# Patient Record
Sex: Female | Born: 1950 | Race: White | Hispanic: No | Marital: Single
Health system: Southern US, Community
[De-identification: ages and names within clinical notes are randomized; demographics above are authoritative.]

## PROBLEM LIST (undated history)

## (undated) DIAGNOSIS — I480 Paroxysmal atrial fibrillation: Secondary | ICD-10-CM

## (undated) DIAGNOSIS — A419 Sepsis, unspecified organism: Secondary | ICD-10-CM

## (undated) DIAGNOSIS — J15211 Pneumonia due to Methicillin susceptible Staphylococcus aureus: Secondary | ICD-10-CM

## (undated) DIAGNOSIS — U071 COVID-19: Secondary | ICD-10-CM

## (undated) DIAGNOSIS — J9621 Acute and chronic respiratory failure with hypoxia: Secondary | ICD-10-CM

## (undated) DIAGNOSIS — I5022 Chronic systolic (congestive) heart failure: Secondary | ICD-10-CM

## (undated) DIAGNOSIS — R6520 Severe sepsis without septic shock: Secondary | ICD-10-CM

---

## 2019-06-19 ENCOUNTER — Inpatient Hospital Stay
Admission: RE | Admit: 2019-06-19 | Discharge: 2019-08-05 | Disposition: A | Discharge status: Skilled Nursing Facility | Payer: Medicare Other | Attending: Internal Medicine | Admitting: Internal Medicine

## 2019-06-19 DIAGNOSIS — J9690 Respiratory failure, unspecified, unspecified whether with hypoxia or hypercapnia: Secondary | ICD-10-CM

## 2019-06-19 DIAGNOSIS — I480 Paroxysmal atrial fibrillation: Secondary | ICD-10-CM | POA: Diagnosis present

## 2019-06-19 DIAGNOSIS — J9621 Acute and chronic respiratory failure with hypoxia: Secondary | ICD-10-CM | POA: Diagnosis present

## 2019-06-19 DIAGNOSIS — J15211 Pneumonia due to Methicillin susceptible Staphylococcus aureus: Secondary | ICD-10-CM | POA: Diagnosis present

## 2019-06-19 DIAGNOSIS — I5022 Chronic systolic (congestive) heart failure: Secondary | ICD-10-CM | POA: Diagnosis present

## 2019-06-19 DIAGNOSIS — Z431 Encounter for attention to gastrostomy: Secondary | ICD-10-CM

## 2019-06-19 DIAGNOSIS — R6520 Severe sepsis without septic shock: Secondary | ICD-10-CM | POA: Diagnosis present

## 2019-06-19 DIAGNOSIS — R52 Pain, unspecified: Secondary | ICD-10-CM

## 2019-06-19 DIAGNOSIS — R000 Tachycardia, unspecified: Secondary | ICD-10-CM

## 2019-06-19 DIAGNOSIS — U071 COVID-19: Secondary | ICD-10-CM | POA: Diagnosis present

## 2019-06-19 DIAGNOSIS — A419 Sepsis, unspecified organism: Secondary | ICD-10-CM | POA: Diagnosis present

## 2019-06-19 HISTORY — DX: Sepsis, unspecified organism: A41.9

## 2019-06-19 HISTORY — DX: COVID-19: U07.1

## 2019-06-19 HISTORY — DX: Acute and chronic respiratory failure with hypoxia: J96.21

## 2019-06-19 HISTORY — DX: Severe sepsis without septic shock: R65.20

## 2019-06-19 HISTORY — DX: Pneumonia due to methicillin susceptible Staphylococcus aureus: J15.211

## 2019-06-19 HISTORY — DX: Paroxysmal atrial fibrillation: I48.0

## 2019-06-19 HISTORY — DX: Chronic systolic (congestive) heart failure: I50.22

## 2019-06-20 ENCOUNTER — Other Ambulatory Visit (HOSPITAL_COMMUNITY): Payer: Medicare Other

## 2019-06-20 DIAGNOSIS — U071 COVID-19: Secondary | ICD-10-CM | POA: Diagnosis not present

## 2019-06-20 DIAGNOSIS — J15211 Pneumonia due to Methicillin susceptible Staphylococcus aureus: Secondary | ICD-10-CM | POA: Diagnosis not present

## 2019-06-20 DIAGNOSIS — J9621 Acute and chronic respiratory failure with hypoxia: Secondary | ICD-10-CM | POA: Diagnosis not present

## 2019-06-20 DIAGNOSIS — I5022 Chronic systolic (congestive) heart failure: Secondary | ICD-10-CM | POA: Diagnosis not present

## 2019-06-20 DIAGNOSIS — I480 Paroxysmal atrial fibrillation: Secondary | ICD-10-CM

## 2019-06-20 DIAGNOSIS — A419 Sepsis, unspecified organism: Secondary | ICD-10-CM

## 2019-06-20 DIAGNOSIS — R6520 Severe sepsis without septic shock: Secondary | ICD-10-CM

## 2019-06-20 LAB — COMPREHENSIVE METABOLIC PANEL
ALT: 30 U/L (ref 0–44)
AST: 30 U/L (ref 15–41)
Albumin: 1.9 g/dL — ABNORMAL LOW (ref 3.5–5.0)
Alkaline Phosphatase: 185 U/L — ABNORMAL HIGH (ref 38–126)
Anion gap: 11 (ref 5–15)
BUN: 21 mg/dL (ref 8–23)
CO2: 28 mmol/L (ref 22–32)
Calcium: 9 mg/dL (ref 8.9–10.3)
Chloride: 101 mmol/L (ref 98–111)
Creatinine, Ser: <0.3 mg/dL — ABNORMAL LOW (ref 0.44–1.00)
Glucose, Bld: 98 mg/dL (ref 70–99)
Potassium: 3.9 mmol/L (ref 3.5–5.1)
Sodium: 140 mmol/L (ref 135–145)
Total Bilirubin: 0.5 mg/dL (ref 0.3–1.2)
Total Protein: 6.2 g/dL — ABNORMAL LOW (ref 6.5–8.1)

## 2019-06-20 LAB — BLOOD GAS, ARTERIAL
Acid-Base Excess: 8.4 mmol/L — ABNORMAL HIGH (ref 0.0–2.0)
Bicarbonate: 32.8 mmol/L — ABNORMAL HIGH (ref 20.0–28.0)
FIO2: 55
O2 Saturation: 96.8 %
Patient temperature: 37
pCO2 arterial: 49.1 mmHg — ABNORMAL HIGH (ref 32.0–48.0)
pH, Arterial: 7.44 (ref 7.350–7.450)
pO2, Arterial: 87.9 mmHg (ref 83.0–108.0)

## 2019-06-20 LAB — BASIC METABOLIC PANEL
Anion gap: 8 (ref 5–15)
BUN: 21 mg/dL (ref 8–23)
CO2: 31 mmol/L (ref 22–32)
Calcium: 9.1 mg/dL (ref 8.9–10.3)
Chloride: 101 mmol/L (ref 98–111)
Creatinine, Ser: <0.3 mg/dL — ABNORMAL LOW (ref 0.44–1.00)
Glucose, Bld: 98 mg/dL (ref 70–99)
Potassium: 3.9 mmol/L (ref 3.5–5.1)
Sodium: 140 mmol/L (ref 135–145)

## 2019-06-20 LAB — CBC
HCT: 27.8 % — ABNORMAL LOW (ref 36.0–46.0)
Hemoglobin: 8 g/dL — ABNORMAL LOW (ref 12.0–15.0)
MCH: 24.8 pg — ABNORMAL LOW (ref 26.0–34.0)
MCHC: 28.8 g/dL — ABNORMAL LOW (ref 30.0–36.0)
MCV: 86.3 fL (ref 80.0–100.0)
Platelets: 417 10*3/uL — ABNORMAL HIGH (ref 150–400)
RBC: 3.22 MIL/uL — ABNORMAL LOW (ref 3.87–5.11)
RDW: 17.3 % — ABNORMAL HIGH (ref 11.5–15.5)
WBC: 13.3 10*3/uL — ABNORMAL HIGH (ref 4.0–10.5)
nRBC: 0 % (ref 0.0–0.2)

## 2019-06-20 LAB — MAGNESIUM: Magnesium: 1.5 mg/dL — ABNORMAL LOW (ref 1.7–2.4)

## 2019-06-20 LAB — C-REACTIVE PROTEIN: CRP: 2.5 mg/dL — ABNORMAL HIGH (ref <1.0)

## 2019-06-20 LAB — TRIGLYCERIDES: Triglycerides: 146 mg/dL (ref <150)

## 2019-06-20 MED ORDER — IOHEXOL 300 MG/ML  SOLN
50.0000 mL | Freq: Once | INTRAMUSCULAR | Status: AC | PRN
  Administered 2019-06-20: 35 mL

## 2019-06-20 NOTE — Consult Note (Signed)
Pulmonary [facility]  PULMONARY SERVICE  Date of Service: 06/20/2019  PULMONARY CRITICAL CARE CONSULT   Laurie Reeves  YBO:[id number]  DOB: Jan 29, 1951   DOA: 06/19/2019  Referring Physician: Merton Border, MD  HPI: Laurie Reeves is a 69 y.o. female seen for follow up of Acute on Chronic Respiratory Failure.  Patient has multiple medical problems including osteoarthritis retinal detachment anxiety disorder respiratory failure bradycardia atrial fibrillation pneumonia presented to the hospital with increasing shortness of breath.  Patient had been found to be positive for COVID-19 approximately 30 days prior.  Patient was transferred to a tertiary level care after having been on the ventilator for 19 days.  Patient had received Decadron remdesivir.  Also was on anticoagulation.  Subsequently was not able to wean so requiring higher level of care.  Patient had a great deal of anxiety issues and it looks like in the notes she has been on high levels of propofol and has not tolerated weaning off of the sedation because of increased agitation.  Other complications included development of sepsis with positive blood cultures for which the patient was treated with antibiotics.  Also patient developed atrial fibrillation requiring Cardizem.  Echocardiogram revealed reduced ejection fraction 40 to 45%.  The aforementioned cultures had grown MSSA and along with this the patient had pneumonia as well as bacteremia.  Patient was switched over to Ancef at that point.  TEE was ordered which was unremarkable for vegetations.  Transferred now on sedation including propofol otherwise no changes has been on assist control and requiring 50% FiO2  Review of Systems:  ROS performed and is unremarkable other than noted above.  Past medical history: Respiratory failure COVID-19 virus infection Atrial fibrillation Bradycardia MSSA sepsis MSSA pneumonia Anxiety Sepsis  Past  surgical history: Tracheostomy  Social history: Unknown about tobacco alcohol or drug abuse  Family history: Noncontributory   Medications: Reviewed on Rounds  Physical Exam:  Vitals: Temperature 97.8 pulse 99 respiratory rate 29 blood pressure is 134/66 saturations 98%  Ventilator Settings on assist control FiO2 is 50% respiratory rate 15 tidal volume 430 PEEP 8  . General: Comfortable at this time . Eyes: Grossly normal lids, irises & conjunctiva . ENT: grossly tongue is normal . Neck: no obvious mass . Cardiovascular: S1-S2 normal no gallop or rub . Respiratory: No rhonchi coarse breath sounds noted at this time . Abdomen: Soft and nontender . Skin: no rash seen on limited exam . Musculoskeletal: not rigid . Psychiatric:unable to assess . Neurologic: no seizure no involuntary movements         Labs on Admission:  Basic Metabolic Panel: Recent Labs  Lab 06/20/19 0439 06/20/19 0721  NA 140 140  K 3.9 3.9  CL 101 101  CO2 31 28  GLUCOSE 98 98  BUN 21 21  CREATININE <0.30* <0.30*  CALCIUM 9.1 9.0  MG  --  1.5*    Recent Labs  Lab 06/20/19 0310  PHART 7.440  PCO2ART 49.1*  PO2ART 87.9  HCO3 32.8*  O2SAT 96.8    Liver Function Tests: Recent Labs  Lab 06/20/19 0721  AST 30  ALT 30  ALKPHOS 185*  BILITOT 0.5  PROT 6.2*  ALBUMIN 1.9*   No results for input(s): LIPASE, AMYLASE in the last 168 hours. No results for input(s): AMMONIA in the last 168 hours.  CBC: Recent Labs  Lab 06/20/19 0721  WBC 13.3*  HGB 8.0*  HCT 27.8*  MCV 86.3  PLT 417*  Cardiac Enzymes: No results for input(s): CKTOTAL, CKMB, CKMBINDEX, TROPONINI in the last 168 hours.  BNP (last 3 results) No results for input(s): BNP in the last 8760 hours.  ProBNP (last 3 results) No results for input(s): PROBNP in the last 8760 hours.   Radiological Exams on Admission: DG ABDOMEN PEG TUBE LOCATION  Result Date: 06/20/2019 CLINICAL DATA:  Peg tube adjustment,  respiratory failure EXAM: ABDOMEN - 1 VIEW COMPARISON:  None. FINDINGS: Percutaneous gastrostomy tube overlies the proximal body of the stomach. Injected oral contrast is seen within lumen of the stomach and descending duodenum. No evidence of extraluminal contrast leak. No dilated small bowel loops. No evidence of pneumatosis or pneumoperitoneum. Mild lumbar spondylosis. Midline low pelvic catheter. IMPRESSION: Percutaneous gastrostomy tube overlies the proximal body of the stomach. Injected oral contrast is seen within the lumen of the stomach and descending duodenum. No evidence of extraluminal contrast leak. Nonobstructive bowel gas pattern. Electronically Signed   By: Delbert Phenix M.D.   On: 06/20/2019 11:45   DG CHEST PORT 1 VIEW  Result Date: 06/20/2019 CLINICAL DATA:  Respiratory failure EXAM: PORTABLE CHEST 1 VIEW COMPARISON:  09/18/2012 FINDINGS: Diffuse bilateral interstitial and heterogeneous pulmonary opacity with layering pleural effusions. Tracheostomy. Mild cardiomegaly. Left upper extremity PICC, tip position over the lower SVC. IMPRESSION: 1. Diffuse bilateral interstitial and heterogeneous pulmonary opacity with layering pleural effusions, consistent with multifocal edema or infection. 2.  Tracheostomy. 3.  Left upper extremity PICC, tip positioned over the lower SVC. Electronically Signed   By: Lauralyn Primes M.D.   On: 06/20/2019 11:48    Assessment/Plan Principal Problem:   Acute on chronic respiratory failure with hypoxia (HCC) Active Problems:   Paroxysmal atrial fibrillation (HCC)   Severe sepsis (HCC)   Chronic HFrEF (heart failure with reduced ejection fraction) (HCC)   COVID-19 virus infection   MSSA (methicillin susceptible Staphylococcus aureus) pneumonia (HCC)   1. Acute on chronic respiratory failure hypoxia right now remains on the ventilator anxiety issues are quite significant.  Patient has been actually on propofol drip to tolerate keep her calm and sedated.  Ask  for respiratory therapy and nursing staff to try to wean back on the sedation if at all possible and then try to wean protocol if patient is able to tolerate.  Chest x-ray meanwhile still showing diffuse interstitial and changes consistent with COVID-19 pneumonia. 2. Severe sepsis secondary to MSSA has been treated with antibiotics now is in resolution we will continue to follow up on any significant cultures.  She had a TEE done which was unremarkable. 3. Chronic heart failure reduced ejection fraction patient had an EF of 40 to 45% based on the last echocardiogram that was done continue to monitor fluid status closely patient still has some edema possibly in the lungs. 4. COVID-19 virus infection in recovery phase we will continue to follow along. 5. Paroxysmal atrial fibrillation patient's rate is controlled she had been on diltiazem drip at the other facility we will continue to monitor.  I have personally seen and evaluated the patient, evaluated laboratory and imaging results, formulated the assessment and plan and placed orders. The Patient requires high complexity decision making with multiple systems involvement.  Case was discussed on Rounds with the Respiratory Therapy Director and the Respiratory staff Time Spent  Yevonne Pax, MD [facility] Pulmonary Critical Care Medicine Sleep Medicine

## 2019-06-21 ENCOUNTER — Encounter: Payer: Self-pay | Admitting: Internal Medicine

## 2019-06-21 DIAGNOSIS — J9621 Acute and chronic respiratory failure with hypoxia: Secondary | ICD-10-CM | POA: Diagnosis not present

## 2019-06-21 DIAGNOSIS — I5022 Chronic systolic (congestive) heart failure: Secondary | ICD-10-CM | POA: Diagnosis not present

## 2019-06-21 DIAGNOSIS — A419 Sepsis, unspecified organism: Secondary | ICD-10-CM | POA: Diagnosis present

## 2019-06-21 DIAGNOSIS — U071 COVID-19: Secondary | ICD-10-CM | POA: Diagnosis not present

## 2019-06-21 DIAGNOSIS — J15211 Pneumonia due to Methicillin susceptible Staphylococcus aureus: Secondary | ICD-10-CM | POA: Diagnosis present

## 2019-06-21 DIAGNOSIS — I480 Paroxysmal atrial fibrillation: Secondary | ICD-10-CM | POA: Diagnosis present

## 2019-06-21 DIAGNOSIS — R6520 Severe sepsis without septic shock: Secondary | ICD-10-CM | POA: Diagnosis present

## 2019-06-21 MED ORDER — FENTANYL CITRATE (PF) 2500 MCG/50ML IJ SOLN
25.00 | INTRAMUSCULAR | Status: DC
Start: ? — End: 2019-06-21

## 2019-06-21 MED ORDER — QUETIAPINE FUMARATE 25 MG PO TABS
100.00 | ORAL_TABLET | ORAL | Status: DC
Start: 2019-06-20 — End: 2019-06-21

## 2019-06-21 MED ORDER — SODIUM CHLORIDE 0.9 % IV SOLN
10.00 | INTRAVENOUS | Status: DC
Start: ? — End: 2019-06-21

## 2019-06-21 MED ORDER — ACETAMINOPHEN 160 MG/5ML PO SUSP
650.00 | ORAL | Status: DC
Start: ? — End: 2019-06-21

## 2019-06-21 MED ORDER — DIPHENOXYLATE-ATROPINE 2.5-0.025 MG PO TABS
2.00 | ORAL_TABLET | ORAL | Status: DC
Start: ? — End: 2019-06-21

## 2019-06-21 MED ORDER — OXYCODONE HCL 5 MG PO TABS
10.00 | ORAL_TABLET | ORAL | Status: DC
Start: 2019-06-20 — End: 2019-06-21

## 2019-06-21 MED ORDER — FENTANYL CITRATE-NACL 2.5-0.9 MG/250ML-% IV SOLN
1.00 | INTRAVENOUS | Status: DC
Start: ? — End: 2019-06-21

## 2019-06-21 MED ORDER — POLYETHYLENE GLYCOL 3350 17 GM/SCOOP PO POWD
17.00 | ORAL | Status: DC
Start: 2019-06-20 — End: 2019-06-21

## 2019-06-21 MED ORDER — THIAMINE HCL 100 MG PO TABS
100.00 | ORAL_TABLET | ORAL | Status: DC
Start: 2019-06-20 — End: 2019-06-21

## 2019-06-21 MED ORDER — DEXMEDETOMIDINE HCL IN NACL 400 MCG/100ML IV SOLN
0.10 | INTRAVENOUS | Status: DC
Start: ? — End: 2019-06-21

## 2019-06-21 MED ORDER — GENERIC EXTERNAL MEDICATION
Status: DC
Start: ? — End: 2019-06-21

## 2019-06-21 MED ORDER — CLOTRIMAZOLE 1 % EX CREA
TOPICAL_CREAM | CUTANEOUS | Status: DC
Start: 2019-06-20 — End: 2019-06-21

## 2019-06-21 MED ORDER — MIDAZOLAM HCL 2 MG/2ML IJ SOLN
2.00 | INTRAMUSCULAR | Status: DC
Start: ? — End: 2019-06-21

## 2019-06-21 MED ORDER — METOPROLOL TARTRATE 25 MG PO TABS
12.50 | ORAL_TABLET | ORAL | Status: DC
Start: 2019-06-20 — End: 2019-06-21

## 2019-06-21 MED ORDER — SERTRALINE HCL 100 MG PO TABS
100.00 | ORAL_TABLET | ORAL | Status: DC
Start: 2019-06-20 — End: 2019-06-21

## 2019-06-21 MED ORDER — ENOXAPARIN SODIUM 40 MG/0.4ML ~~LOC~~ SOLN
40.00 | SUBCUTANEOUS | Status: DC
Start: 2019-06-20 — End: 2019-06-21

## 2019-06-21 MED ORDER — TRAZODONE HCL 50 MG PO TABS
50.00 | ORAL_TABLET | ORAL | Status: DC
Start: 2019-06-20 — End: 2019-06-21

## 2019-06-21 MED ORDER — PHENOBARBITAL 30 MG PO TABS
97.20 | ORAL_TABLET | ORAL | Status: DC
Start: 2019-06-20 — End: 2019-06-21

## 2019-06-21 MED ORDER — FOLIC ACID 1 MG PO TABS
1.00 | ORAL_TABLET | ORAL | Status: DC
Start: 2019-06-20 — End: 2019-06-21

## 2019-06-21 MED ORDER — LABETALOL HCL 5 MG/ML IV SOLN
20.00 | INTRAVENOUS | Status: DC
Start: ? — End: 2019-06-21

## 2019-06-21 MED ORDER — CYANOCOBALAMIN 1000 MCG PO TABS
1000.00 | ORAL_TABLET | ORAL | Status: DC
Start: 2019-06-20 — End: 2019-06-21

## 2019-06-21 MED ORDER — LORAZEPAM 2 MG/ML IJ SOLN
1.00 | INTRAMUSCULAR | Status: DC
Start: ? — End: 2019-06-21

## 2019-06-21 NOTE — Progress Notes (Signed)
Pulmonary [facility]   PULMONARY CRITICAL CARE SERVICE  PROGRESS NOTE  Date of Service: 06/21/2019  Laurie Reeves  ENI:[id number]  DOB: Nov 27, 1950   DOA: 06/19/2019  Referring Physician: Merton Border, MD  HPI: Laurie Reeves is a 69 y.o. female seen for follow up of Acute on Chronic Respiratory Failure.  Patient currently on pressure support has been on 50% FiO2 has a lot of issues with anxiety still requiring sedation  Medications: Reviewed on Rounds  Physical Exam:  Vitals: Temperature is 98.4 pulse 98 respiratory rate 21 blood pressure is 119/64 saturations 98%  Ventilator Settings on pressure support FiO2 50% pressure 12 PEEP 8  . General: Comfortable at this time . Eyes: Grossly normal lids, irises & conjunctiva . ENT: grossly tongue is normal . Neck: no obvious mass . Cardiovascular: S1 S2 normal no gallop . Respiratory: No rhonchi no rales are noted at this time . Abdomen: soft . Skin: no rash seen on limited exam . Musculoskeletal: not rigid . Psychiatric:unable to assess . Neurologic: no seizure no involuntary movements         Lab Data:   Basic Metabolic Panel: Recent Labs  Lab 06/20/19 0439 06/20/19 0721  NA 140 140  K 3.9 3.9  CL 101 101  CO2 31 28  GLUCOSE 98 98  BUN 21 21  CREATININE <0.30* <0.30*  CALCIUM 9.1 9.0  MG  --  1.5*    ABG: Recent Labs  Lab 06/20/19 0310  PHART 7.440  PCO2ART 49.1*  PO2ART 87.9  HCO3 32.8*  O2SAT 96.8    Liver Function Tests: Recent Labs  Lab 06/20/19 0721  AST 30  ALT 30  ALKPHOS 185*  BILITOT 0.5  PROT 6.2*  ALBUMIN 1.9*   No results for input(s): LIPASE, AMYLASE in the last 168 hours. No results for input(s): AMMONIA in the last 168 hours.  CBC: Recent Labs  Lab 06/20/19 0721  WBC 13.3*  HGB 8.0*  HCT 27.8*  MCV 86.3  PLT 417*    Cardiac Enzymes: No results for input(s): CKTOTAL, CKMB, CKMBINDEX, TROPONINI in the last 168 hours.  BNP (last  3 results) No results for input(s): BNP in the last 8760 hours.  ProBNP (last 3 results) No results for input(s): PROBNP in the last 8760 hours.  Radiological Exams: DG ABDOMEN PEG TUBE LOCATION  Result Date: 06/20/2019 CLINICAL DATA:  Peg tube adjustment, respiratory failure EXAM: ABDOMEN - 1 VIEW COMPARISON:  None. FINDINGS: Percutaneous gastrostomy tube overlies the proximal body of the stomach. Injected oral contrast is seen within lumen of the stomach and descending duodenum. No evidence of extraluminal contrast leak. No dilated small bowel loops. No evidence of pneumatosis or pneumoperitoneum. Mild lumbar spondylosis. Midline low pelvic catheter. IMPRESSION: Percutaneous gastrostomy tube overlies the proximal body of the stomach. Injected oral contrast is seen within the lumen of the stomach and descending duodenum. No evidence of extraluminal contrast leak. Nonobstructive bowel gas pattern. Electronically Signed   By: Ilona Sorrel M.D.   On: 06/20/2019 11:45   DG CHEST PORT 1 VIEW  Result Date: 06/20/2019 CLINICAL DATA:  Respiratory failure EXAM: PORTABLE CHEST 1 VIEW COMPARISON:  09/18/2012 FINDINGS: Diffuse bilateral interstitial and heterogeneous pulmonary opacity with layering pleural effusions. Tracheostomy. Mild cardiomegaly. Left upper extremity PICC, tip position over the lower SVC. IMPRESSION: 1. Diffuse bilateral interstitial and heterogeneous pulmonary opacity with layering pleural effusions, consistent with multifocal edema or infection. 2.  Tracheostomy. 3.  Left upper extremity PICC, tip  positioned over the lower SVC. Electronically Signed   By: Lauralyn Primes M.D.   On: 06/20/2019 11:48    Assessment/Plan Principal Problem:   Acute on chronic respiratory failure with hypoxia (HCC) Active Problems:   Paroxysmal atrial fibrillation (HCC)   Severe sepsis (HCC)   Chronic HFrEF (heart failure with reduced ejection fraction) (HCC)   COVID-19 virus infection   MSSA (methicillin  susceptible Staphylococcus aureus) pneumonia (HCC)   1. Acute on chronic respiratory failure hypoxia continue with pressure support titrate oxygen continue pulmonary toilet.  Patient does seem to be tolerating weaning although still requiring sedation for agitation anxiety 2. Paroxysmal atrial fibrillation rate is controlled at this time 3. Severe sepsis resolved hemodynamics are stable 4. Chronic heart failure reduced ejection fraction we will continue with supportive care 5. COVID-19 virus infection pneumonia treated resolved 6. MSSA pneumonia treated clinically improved we will continue to monitor closely   I have personally seen and evaluated the patient, evaluated laboratory and imaging results, formulated the assessment and plan and placed orders. The Patient requires high complexity decision making with multiple systems involvement.  Rounds were done with the Respiratory Therapy Director and Staff therapists and discussed with nursing staff also.  Time 35 minutes  Yevonne Pax, MD [facility] Pulmonary Critical Care Medicine Sleep Medicine

## 2019-06-22 DIAGNOSIS — J15211 Pneumonia due to Methicillin susceptible Staphylococcus aureus: Secondary | ICD-10-CM | POA: Diagnosis not present

## 2019-06-22 DIAGNOSIS — J9621 Acute and chronic respiratory failure with hypoxia: Secondary | ICD-10-CM | POA: Diagnosis not present

## 2019-06-22 DIAGNOSIS — U071 COVID-19: Secondary | ICD-10-CM | POA: Diagnosis not present

## 2019-06-22 DIAGNOSIS — I5022 Chronic systolic (congestive) heart failure: Secondary | ICD-10-CM | POA: Diagnosis not present

## 2019-06-22 NOTE — Progress Notes (Signed)
Pulmonary Critical Care Medicine [facility] [place]   PULMONARY CRITICAL CARE SERVICE  PROGRESS NOTE  Date of Service: 06/22/2019  Laurie Reeves  LGX:[id number]  DOB: 07/23/1950   DOA: 06/19/2019  Referring Physician: Carron Curie, MD  HPI: Laurie Reeves is a 69 y.o. female seen for follow up of Acute on Chronic Respiratory Failure.  Patient currently is on assist control mode appears to be comfortable right now without distress at this time has been on a PEEP of 8.  She still remains on propofol for anxiety and agitation.  Medications: Reviewed on Rounds  Physical Exam:  Vitals: Temperature is 98.9 pulse 92 respiratory 21 blood pressure is 153/93 saturations 100%  Ventilator Settings on assist control FiO2 is 40% tidal volume 430 PEEP 8  . General: Comfortable at this time . Eyes: Grossly normal lids, irises & conjunctiva . ENT: grossly tongue is normal . Neck: no obvious mass . Cardiovascular: S1 S2 normal no gallop . Respiratory: No rhonchi coarse breath sounds . Abdomen: soft . Skin: no rash seen on limited exam . Musculoskeletal: not rigid . Psychiatric:unable to assess . Neurologic: no seizure no involuntary movements         Lab Data:   Basic Metabolic Panel: Recent Labs  Lab 06/20/19 0439 06/20/19 0721  NA 140 140  K 3.9 3.9  CL 101 101  CO2 31 28  GLUCOSE 98 98  BUN 21 21  CREATININE <0.30* <0.30*  CALCIUM 9.1 9.0  MG  --  1.5*    ABG: Recent Labs  Lab 06/20/19 0310  PHART 7.440  PCO2ART 49.1*  PO2ART 87.9  HCO3 32.8*  O2SAT 96.8    Liver Function Tests: Recent Labs  Lab 06/20/19 0721  AST 30  ALT 30  ALKPHOS 185*  BILITOT 0.5  PROT 6.2*  ALBUMIN 1.9*   No results for input(s): LIPASE, AMYLASE in the last 168 hours. No results for input(s): AMMONIA in the last 168 hours.  CBC: Recent Labs  Lab 06/20/19 0721  WBC 13.3*  HGB 8.0*  HCT 27.8*  MCV 86.3  PLT 417*    Cardiac Enzymes: No results for input(s):  CKTOTAL, CKMB, CKMBINDEX, TROPONINI in the last 168 hours.  BNP (last 3 results) No results for input(s): BNP in the last 8760 hours.  ProBNP (last 3 results) No results for input(s): PROBNP in the last 8760 hours.  Radiological Exams: No results found.  Assessment/Plan Principal Problem:   Acute on chronic respiratory failure with hypoxia (HCC) Active Problems:   Paroxysmal atrial fibrillation (HCC)   Severe sepsis (HCC)   Chronic HFrEF (heart failure with reduced ejection fraction) (HCC)   COVID-19 virus infection   MSSA (methicillin susceptible Staphylococcus aureus) pneumonia (HCC)   1. Acute on chronic respiratory failure hypoxia plan is to continue with trying to make wean assessments and try to wean the patient however the agitation has been a major issue.  Discussed with primary care team.  Currently patient is on propofol 2. Paroxysmal atrial fibrillation rate controlled continue to monitor 3. Severe sepsis resolved hemodynamics are stable 4. Chronic heart failure reduced ejection fraction we will continue to monitor 5. COVID-19 virus infection at baseline we will continue to follow 6. MSSA pneumonia treated clinically is improving   I have personally seen and evaluated the patient, evaluated laboratory and imaging results, formulated the assessment and plan and placed orders. The Patient requires high complexity decision making with multiple systems involvement.  Rounds were done with the Respiratory Therapy  Librarian, academic therapists and discussed with nursing staff also.  Allyne Gee, MD [facility] Pulmonary Critical Care Medicine Sleep Medicine

## 2019-06-23 DIAGNOSIS — I5022 Chronic systolic (congestive) heart failure: Secondary | ICD-10-CM | POA: Diagnosis not present

## 2019-06-23 DIAGNOSIS — J15211 Pneumonia due to Methicillin susceptible Staphylococcus aureus: Secondary | ICD-10-CM | POA: Diagnosis not present

## 2019-06-23 DIAGNOSIS — J9621 Acute and chronic respiratory failure with hypoxia: Secondary | ICD-10-CM | POA: Diagnosis not present

## 2019-06-23 DIAGNOSIS — U071 COVID-19: Secondary | ICD-10-CM | POA: Diagnosis not present

## 2019-06-23 LAB — BASIC METABOLIC PANEL
Anion gap: 12 (ref 5–15)
BUN: 10 mg/dL (ref 8–23)
CO2: 35 mmol/L — ABNORMAL HIGH (ref 22–32)
Calcium: 9.2 mg/dL (ref 8.9–10.3)
Chloride: 94 mmol/L — ABNORMAL LOW (ref 98–111)
Creatinine, Ser: <0.3 mg/dL — ABNORMAL LOW (ref 0.44–1.00)
Glucose, Bld: 129 mg/dL — ABNORMAL HIGH (ref 70–99)
Potassium: 3.6 mmol/L (ref 3.5–5.1)
Sodium: 141 mmol/L (ref 135–145)

## 2019-06-23 LAB — CBC
HCT: 31.7 % — ABNORMAL LOW (ref 36.0–46.0)
Hemoglobin: 9.1 g/dL — ABNORMAL LOW (ref 12.0–15.0)
MCH: 24.9 pg — ABNORMAL LOW (ref 26.0–34.0)
MCHC: 28.7 g/dL — ABNORMAL LOW (ref 30.0–36.0)
MCV: 86.8 fL (ref 80.0–100.0)
Platelets: 376 10*3/uL (ref 150–400)
RBC: 3.65 MIL/uL — ABNORMAL LOW (ref 3.87–5.11)
RDW: 17.5 % — ABNORMAL HIGH (ref 11.5–15.5)
WBC: 11.8 10*3/uL — ABNORMAL HIGH (ref 4.0–10.5)
nRBC: 0 % (ref 0.0–0.2)

## 2019-06-23 LAB — TRIGLYCERIDES: Triglycerides: 169 mg/dL — ABNORMAL HIGH (ref <150)

## 2019-06-23 MED ORDER — GENERIC EXTERNAL MEDICATION
Status: DC
Start: ? — End: 2019-06-23

## 2019-06-23 NOTE — Progress Notes (Signed)
Pulmonary Critical Care Medicine [facility] [place]   PULMONARY CRITICAL CARE SERVICE  PROGRESS NOTE  Date of Service: 06/23/2019  Laurie Reeves  UTM:[id number]  DOB: [birth year]   DOA: 06/19/2019  Referring Physician: Carron Curie, MD  HPI: Laurie Reeves is a 69 y.o. female seen for follow up of Acute on Chronic Respiratory Failure.  Patient is currently on pressure support has been on 45% FiO2 12/8  Medications: Reviewed on Rounds  Physical Exam:  Vitals: Temperature 97.8 pulse 108 respiratory 22 blood pressure is 161/96 saturations 95%  Ventilator Settings on pressure support FiO2 45% tidal lines 456 pressure support 12 PEEP 8  . General: Comfortable at this time . Eyes: Grossly normal lids, irises & conjunctiva . ENT: grossly tongue is normal . Neck: no obvious mass . Cardiovascular: S1 S2 normal no gallop . Respiratory: No rhonchi no rales are noted at this time. . Abdomen: soft . Skin: no rash seen on limited exam . Musculoskeletal: not rigid . Psychiatric:unable to assess . Neurologic: no seizure no involuntary movements         Lab Data:   Basic Metabolic Panel: Recent Labs  Lab 06/20/19 0439 06/20/19 0721 06/23/19 0444  NA 140 140 141  K 3.9 3.9 3.6  CL 101 101 94*  CO2 31 28 35*  GLUCOSE 98 98 129*  BUN 21 21 10   CREATININE <0.30* <0.30* <0.30*  CALCIUM 9.1 9.0 9.2  MG  --  1.5*  --     ABG: Recent Labs  Lab 06/20/19 0310  PHART 7.440  PCO2ART 49.1*  PO2ART 87.9  HCO3 32.8*  O2SAT 96.8    Liver Function Tests: Recent Labs  Lab 06/20/19 0721  AST 30  ALT 30  ALKPHOS 185*  BILITOT 0.5  PROT 6.2*  ALBUMIN 1.9*   No results for input(s): LIPASE, AMYLASE in the last 168 hours. No results for input(s): AMMONIA in the last 168 hours.  CBC: Recent Labs  Lab 06/20/19 0721 06/23/19 0444  WBC 13.3* 11.8*  HGB 8.0* 9.1*  HCT 27.8* 31.7*  MCV 86.3 86.8  PLT 417* 376    Cardiac Enzymes: No results for input(s):  CKTOTAL, CKMB, CKMBINDEX, TROPONINI in the last 168 hours.  BNP (last 3 results) No results for input(s): BNP in the last 8760 hours.  ProBNP (last 3 results) No results for input(s): PROBNP in the last 8760 hours.  Radiological Exams: No results found.  Assessment/Plan Principal Problem:   Acute on chronic respiratory failure with hypoxia (HCC) Active Problems:   Paroxysmal atrial fibrillation (HCC)   Severe sepsis (HCC)   Chronic HFrEF (heart failure with reduced ejection fraction) (HCC)   COVID-19 virus infection   MSSA (methicillin susceptible Staphylococcus aureus) pneumonia (HCC)   1. Acute on chronic respiratory failure hypoxia she is on the weaning protocol plan is to continue the weaning at this time. 2. Paroxysmal atrial fibrillation rate is controlled at this time we will continue to follow 3. Severe sepsis resolved 4. Chronic heart failure reduced ejection fraction compensated monitor fluid status 5. COVID-19 virus infection treated resolved 6. MSSA pneumonia treated we will continue to follow   I have personally seen and evaluated the patient, evaluated laboratory and imaging results, formulated the assessment and plan and placed orders. The Patient requires high complexity decision making with multiple systems involvement.  Rounds were done with the Respiratory Therapy Director and Staff therapists and discussed with nursing staff also.  08/23/19, MD [facility] Pulmonary Critical Care Medicine  Sleep Medicine

## 2019-06-24 DIAGNOSIS — J9621 Acute and chronic respiratory failure with hypoxia: Secondary | ICD-10-CM | POA: Diagnosis not present

## 2019-06-24 DIAGNOSIS — I5022 Chronic systolic (congestive) heart failure: Secondary | ICD-10-CM | POA: Diagnosis not present

## 2019-06-24 DIAGNOSIS — J15211 Pneumonia due to Methicillin susceptible Staphylococcus aureus: Secondary | ICD-10-CM | POA: Diagnosis not present

## 2019-06-24 DIAGNOSIS — U071 COVID-19: Secondary | ICD-10-CM | POA: Diagnosis not present

## 2019-06-24 NOTE — Progress Notes (Addendum)
Pulmonary Critical Care Medicine [facility] [place]   PULMONARY CRITICAL CARE SERVICE  PROGRESS NOTE  Date of Service: 06/24/2019  Laurie Reeves  WNI:[id number]  DOB: Feb 27, 1950   DOA: 06/19/2019  Referring Physician: Carron Curie, MD  HPI: Laurie Reeves is a 69 y.o. female seen for follow up of Acute on Chronic Respiratory Failure.  Patient is a 12-hour goal today on pressure support with 50% FiO2 satting well no distress.  Medications: Reviewed on Rounds  Physical Exam:  Vitals: Pulse 23 respirations 26 BP 142/77 O2 sat 98% temp 98.7  Ventilator Settings pressure support 12/8 FiO2 50%  . General: Comfortable at this time . Eyes: Grossly normal lids, irises & conjunctiva . ENT: grossly tongue is normal . Neck: no obvious mass . Cardiovascular: S1 S2 normal no gallop . Respiratory: No rales or rhonchi noted . Abdomen: soft . Skin: no rash seen on limited exam . Musculoskeletal: not rigid . Psychiatric:unable to assess . Neurologic: no seizure no involuntary movements         Lab Data:   Basic Metabolic Panel: Recent Labs  Lab 06/20/19 0439 06/20/19 0721 06/23/19 0444  NA 140 140 141  K 3.9 3.9 3.6  CL 101 101 94*  CO2 31 28 35*  GLUCOSE 98 98 129*  BUN 21 21 10   CREATININE <0.30* <0.30* <0.30*  CALCIUM 9.1 9.0 9.2  MG  --  1.5*  --     ABG: Recent Labs  Lab 06/20/19 0310  PHART 7.440  PCO2ART 49.1*  PO2ART 87.9  HCO3 32.8*  O2SAT 96.8    Liver Function Tests: Recent Labs  Lab 06/20/19 0721  AST 30  ALT 30  ALKPHOS 185*  BILITOT 0.5  PROT 6.2*  ALBUMIN 1.9*   No results for input(s): LIPASE, AMYLASE in the last 168 hours. No results for input(s): AMMONIA in the last 168 hours.  CBC: Recent Labs  Lab 06/20/19 0721 06/23/19 0444  WBC 13.3* 11.8*  HGB 8.0* 9.1*  HCT 27.8* 31.7*  MCV 86.3 86.8  PLT 417* 376    Cardiac Enzymes: No results for input(s): CKTOTAL, CKMB, CKMBINDEX, TROPONINI in the last 168 hours.  BNP  (last 3 results) No results for input(s): BNP in the last 8760 hours.  ProBNP (last 3 results) No results for input(s): PROBNP in the last 8760 hours.  Radiological Exams: No results found.  Assessment/Plan Principal Problem:   Acute on chronic respiratory failure with hypoxia (HCC) Active Problems:   Paroxysmal atrial fibrillation (HCC)   Severe sepsis (HCC)   Chronic HFrEF (heart failure with reduced ejection fraction) (HCC)   COVID-19 virus infection   MSSA (methicillin susceptible Staphylococcus aureus) pneumonia (HCC)   1. Acute on chronic respiratory failure with hypoxia.  Continues to wean at this time for a goal of 12 hours on pressure support 8% FiO2 continue supportive measures and pulmonary toilet. 2. Paroxysmal atrial fibrillation rate is controlled at this time we will continue to follow 3. Severe sepsis resolved 4. Chronic heart failure reduced ejection fraction compensated monitor fluid status 5. COVID-19 virus infection treated resolved 6. MSSA pneumonia treated we will continue to follow   I have personally seen and evaluated the patient, evaluated laboratory and imaging results, formulated the assessment and plan and placed orders. The Patient requires high complexity decision making with multiple systems involvement.  Rounds were done with the Respiratory Therapy Director and Staff therapists and discussed with nursing staff also.  08/23/19, MD [facility] Pulmonary Critical Care  Medicine Sleep Medicine

## 2019-06-25 DIAGNOSIS — U071 COVID-19: Secondary | ICD-10-CM | POA: Diagnosis not present

## 2019-06-25 DIAGNOSIS — J9621 Acute and chronic respiratory failure with hypoxia: Secondary | ICD-10-CM | POA: Diagnosis not present

## 2019-06-25 DIAGNOSIS — J15211 Pneumonia due to Methicillin susceptible Staphylococcus aureus: Secondary | ICD-10-CM | POA: Diagnosis not present

## 2019-06-25 DIAGNOSIS — I5022 Chronic systolic (congestive) heart failure: Secondary | ICD-10-CM | POA: Diagnosis not present

## 2019-06-25 LAB — BASIC METABOLIC PANEL WITH GFR
Anion gap: 10 (ref 5–15)
BUN: 12 mg/dL (ref 8–23)
CO2: 41 mmol/L — ABNORMAL HIGH (ref 22–32)
Calcium: 9 mg/dL (ref 8.9–10.3)
Chloride: 89 mmol/L — ABNORMAL LOW (ref 98–111)
Creatinine, Ser: <0.3 mg/dL — ABNORMAL LOW (ref 0.44–1.00)
Glucose, Bld: 140 mg/dL — ABNORMAL HIGH (ref 70–99)
Potassium: 3.6 mmol/L (ref 3.5–5.1)
Sodium: 140 mmol/L (ref 135–145)

## 2019-06-25 LAB — CBC
HCT: 31.6 % — ABNORMAL LOW (ref 36.0–46.0)
Hemoglobin: 8.7 g/dL — ABNORMAL LOW (ref 12.0–15.0)
MCH: 24.2 pg — ABNORMAL LOW (ref 26.0–34.0)
MCHC: 27.5 g/dL — ABNORMAL LOW (ref 30.0–36.0)
MCV: 87.8 fL (ref 80.0–100.0)
Platelets: 359 K/uL (ref 150–400)
RBC: 3.6 MIL/uL — ABNORMAL LOW (ref 3.87–5.11)
RDW: 17.6 % — ABNORMAL HIGH (ref 11.5–15.5)
WBC: 9.7 K/uL (ref 4.0–10.5)
nRBC: 0 % (ref 0.0–0.2)

## 2019-06-25 MED ORDER — GENERIC EXTERNAL MEDICATION
Status: DC
Start: ? — End: 2019-06-25

## 2019-06-25 NOTE — Progress Notes (Signed)
Pulmonary Critical Care Medicine [facility] [place]   PULMONARY CRITICAL CARE SERVICE  PROGRESS NOTE  Date of Service: 06/25/2019  Laurie Reeves  NGE:[id number]  DOB: Apr 05, 1950   DOA: 06/19/2019  Referring Physician: Carron Curie, MD  HPI: Laurie Reeves is a 69 y.o. female seen for follow up of Acute on Chronic Respiratory Failure.  Patient currently is on pressure support has been on 45% FiO2 currently is on 12/5 the goal is for 16 hours  Medications: Reviewed on Rounds  Physical Exam:  Vitals: Temperature 97.6 pulse 106 respiratory 24 blood pressure is 156/81 saturations 97%  Ventilator Settings on pressure support FiO2 is 45% pressure poor 12 PEEP 5 tidal volume is 345  . General: Comfortable at this time . Eyes: Grossly normal lids, irises & conjunctiva . ENT: grossly tongue is normal . Neck: no obvious mass . Cardiovascular: S1 S2 normal no gallop . Respiratory: No rhonchi coarse breath sounds are noted at this time . Abdomen: soft . Skin: no rash seen on limited exam . Musculoskeletal: not rigid . Psychiatric:unable to assess . Neurologic: no seizure no involuntary movements         Lab Data:   Basic Metabolic Panel: Recent Labs  Lab 06/20/19 0439 06/20/19 0721 06/23/19 0444 06/25/19 0524  NA 140 140 141 140  K 3.9 3.9 3.6 3.6  CL 101 101 94* 89*  CO2 31 28 35* 41*  GLUCOSE 98 98 129* 140*  BUN 21 21 10 12   CREATININE <0.30* <0.30* <0.30* <0.30*  CALCIUM 9.1 9.0 9.2 9.0  MG  --  1.5*  --   --     ABG: Recent Labs  Lab 06/20/19 0310  PHART 7.440  PCO2ART 49.1*  PO2ART 87.9  HCO3 32.8*  O2SAT 96.8    Liver Function Tests: Recent Labs  Lab 06/20/19 0721  AST 30  ALT 30  ALKPHOS 185*  BILITOT 0.5  PROT 6.2*  ALBUMIN 1.9*   No results for input(s): LIPASE, AMYLASE in the last 168 hours. No results for input(s): AMMONIA in the last 168 hours.  CBC: Recent Labs  Lab 06/20/19 0721 06/23/19 0444 06/25/19 0524  WBC 13.3*  11.8* 9.7  HGB 8.0* 9.1* 8.7*  HCT 27.8* 31.7* 31.6*  MCV 86.3 86.8 87.8  PLT 417* 376 359    Cardiac Enzymes: No results for input(s): CKTOTAL, CKMB, CKMBINDEX, TROPONINI in the last 168 hours.  BNP (last 3 results) No results for input(s): BNP in the last 8760 hours.  ProBNP (last 3 results) No results for input(s): PROBNP in the last 8760 hours.  Radiological Exams: No results found.  Assessment/Plan Principal Problem:   Acute on chronic respiratory failure with hypoxia (HCC) Active Problems:   Paroxysmal atrial fibrillation (HCC)   Severe sepsis (HCC)   Chronic HFrEF (heart failure with reduced ejection fraction) (HCC)   COVID-19 virus infection   MSSA (methicillin susceptible Staphylococcus aureus) pneumonia (HCC)   1. Acute on chronic respiratory failure hypoxia we will continue with weaning on pressure support today the goal is 16 hours 2. Paroxysmal atrial fibrillation rate is controlled we will continue to monitor 3. Severe sepsis resolved hemodynamics are stable 4. Chronic heart failure reduced ejection fraction continue with supportive care 5. COVID-19 virus infection at baseline 6. MSSA pneumonia treated we will continue to monitor closely   I have personally seen and evaluated the patient, evaluated laboratory and imaging results, formulated the assessment and plan and placed orders. The Patient requires high complexity decision making  with multiple systems involvement.  Rounds were done with the Respiratory Therapy Director and Staff therapists and discussed with nursing staff also.  Allyne Gee, MD [facility] Pulmonary Critical Care Medicine Sleep Medicine

## 2019-06-26 DIAGNOSIS — U071 COVID-19: Secondary | ICD-10-CM | POA: Diagnosis not present

## 2019-06-26 DIAGNOSIS — J9621 Acute and chronic respiratory failure with hypoxia: Secondary | ICD-10-CM | POA: Diagnosis not present

## 2019-06-26 DIAGNOSIS — I5022 Chronic systolic (congestive) heart failure: Secondary | ICD-10-CM | POA: Diagnosis not present

## 2019-06-26 DIAGNOSIS — J15211 Pneumonia due to Methicillin susceptible Staphylococcus aureus: Secondary | ICD-10-CM | POA: Diagnosis not present

## 2019-06-26 LAB — BASIC METABOLIC PANEL
Anion gap: 7 (ref 5–15)
BUN: 12 mg/dL (ref 8–23)
CO2: 42 mmol/L — ABNORMAL HIGH (ref 22–32)
Calcium: 9.1 mg/dL (ref 8.9–10.3)
Chloride: 89 mmol/L — ABNORMAL LOW (ref 98–111)
Creatinine, Ser: <0.3 mg/dL — ABNORMAL LOW (ref 0.44–1.00)
Glucose, Bld: 114 mg/dL — ABNORMAL HIGH (ref 70–99)
Potassium: 4 mmol/L (ref 3.5–5.1)
Sodium: 138 mmol/L (ref 135–145)

## 2019-06-26 LAB — TRIGLYCERIDES: Triglycerides: 193 mg/dL — ABNORMAL HIGH (ref <150)

## 2019-06-26 NOTE — Progress Notes (Addendum)
Pulmonary [place]   PULMONARY CRITICAL CARE SERVICE  PROGRESS NOTE  Date of Service: 06/26/2019  Veeda Virgo  YPP:[id number]  DOB: March 29, 1950   DOA: 06/19/2019  Referring Physician: Merton Border, MD  HPI: Laurie Reeves is a 69 y.o. female seen for follow up of Acute on Chronic Respiratory Failure.  Patient remains on full support ventilator at this time did come for 1/2 hours on 50% aerosol trach collar satting well no distress.  Medications: Reviewed on Rounds  Physical Exam:  Vitals: Pulse 111 respirations 28 BP 150/88 O2 sat 96% temp 98.6  Ventilator Settings ventilator mode AC VC rate of 15 tidal volume 430 PEEP of 8 with an FiO2 of 50%  . General: Comfortable at this time . Eyes: Grossly normal lids, irises & conjunctiva . ENT: grossly tongue is normal . Neck: no obvious mass . Cardiovascular: S1 S2 normal no gallop . Respiratory: No rales or rhonchi noted . Abdomen: soft . Skin: no rash seen on limited exam . Musculoskeletal: not rigid . Psychiatric:unable to assess . Neurologic: no seizure no involuntary movements         Lab Data:   Basic Metabolic Panel: Recent Labs  Lab 06/20/19 0439 06/20/19 0721 06/23/19 0444 06/25/19 0524 06/26/19 0710  NA 140 140 141 140 138  K 3.9 3.9 3.6 3.6 4.0  CL 101 101 94* 89* 89*  CO2 31 28 35* 41* 42*  GLUCOSE 98 98 129* 140* 114*  BUN 21 21 10 12 12   CREATININE <0.30* <0.30* <0.30* <0.30* <0.30*  CALCIUM 9.1 9.0 9.2 9.0 9.1  MG  --  1.5*  --   --   --     ABG: Recent Labs  Lab 06/20/19 0310  PHART 7.440  PCO2ART 49.1*  PO2ART 87.9  HCO3 32.8*  O2SAT 96.8    Liver Function Tests: Recent Labs  Lab 06/20/19 0721  AST 30  ALT 30  ALKPHOS 185*  BILITOT 0.5  PROT 6.2*  ALBUMIN 1.9*   No results for input(s): LIPASE, AMYLASE in the last 168 hours. No results for input(s): AMMONIA in the last 168 hours.  CBC: Recent Labs  Lab 06/20/19 0721 06/23/19 0444  06/25/19 0524  WBC 13.3* 11.8* 9.7  HGB 8.0* 9.1* 8.7*  HCT 27.8* 31.7* 31.6*  MCV 86.3 86.8 87.8  PLT 417* 376 359    Cardiac Enzymes: No results for input(s): CKTOTAL, CKMB, CKMBINDEX, TROPONINI in the last 168 hours.  BNP (last 3 results) No results for input(s): BNP in the last 8760 hours.  ProBNP (last 3 results) No results for input(s): PROBNP in the last 8760 hours.  Radiological Exams: No results found.  Assessment/Plan Principal Problem:   Acute on chronic respiratory failure with hypoxia (HCC) Active Problems:   Paroxysmal atrial fibrillation (HCC)   Severe sepsis (HCC)   Chronic HFrEF (heart failure with reduced ejection fraction) ([place])   COVID-19 virus infection   MSSA (methicillin susceptible Staphylococcus aureus) pneumonia ([place])   1. Acute on chronic respiratory failure with hypoxia patient completed 1/2 hours on 50% aerosol trach collar today.  Is now resting back on the ventilator.  Continue supportive measures and pulmonary toilet.  Continue to attempt weaning per protocol. 2. Paroxysmal atrial fibrillation rate is controlled we will continue to monitor 3. Severe sepsis resolved hemodynamics are stable 4. Chronic heart failure reduced ejection fraction continue with supportive care 5. COVID-19 virus infection at baseline 6. MSSA pneumonia treated we will continue to monitor  closely   I have personally seen and evaluated the patient, evaluated laboratory and imaging results, formulated the assessment and plan and placed orders. The Patient requires high complexity decision making with multiple systems involvement.  Rounds were done with the Respiratory Therapy Director and Staff therapists and discussed with nursing staff also.  Yevonne Pax, MD [facility] Pulmonary Critical Care Medicine Sleep Medicine

## 2019-06-27 ENCOUNTER — Other Ambulatory Visit (HOSPITAL_COMMUNITY): Payer: Medicare Other

## 2019-06-27 DIAGNOSIS — I5022 Chronic systolic (congestive) heart failure: Secondary | ICD-10-CM | POA: Diagnosis not present

## 2019-06-27 DIAGNOSIS — J9621 Acute and chronic respiratory failure with hypoxia: Secondary | ICD-10-CM | POA: Diagnosis not present

## 2019-06-27 DIAGNOSIS — U071 COVID-19: Secondary | ICD-10-CM | POA: Diagnosis not present

## 2019-06-27 DIAGNOSIS — J15211 Pneumonia due to Methicillin susceptible Staphylococcus aureus: Secondary | ICD-10-CM | POA: Diagnosis not present

## 2019-06-27 LAB — CBC
HCT: 28.9 % — ABNORMAL LOW (ref 36.0–46.0)
Hemoglobin: 8.1 g/dL — ABNORMAL LOW (ref 12.0–15.0)
MCH: 24.3 pg — ABNORMAL LOW (ref 26.0–34.0)
MCHC: 28 g/dL — ABNORMAL LOW (ref 30.0–36.0)
MCV: 86.5 fL (ref 80.0–100.0)
Platelets: 347 10*3/uL (ref 150–400)
RBC: 3.34 MIL/uL — ABNORMAL LOW (ref 3.87–5.11)
RDW: 18.5 % — ABNORMAL HIGH (ref 11.5–15.5)
WBC: 8.5 10*3/uL (ref 4.0–10.5)
nRBC: 0 % (ref 0.0–0.2)

## 2019-06-27 LAB — BASIC METABOLIC PANEL
Anion gap: 12 (ref 5–15)
BUN: 15 mg/dL (ref 8–23)
CO2: 38 mmol/L — ABNORMAL HIGH (ref 22–32)
Calcium: 9 mg/dL (ref 8.9–10.3)
Chloride: 87 mmol/L — ABNORMAL LOW (ref 98–111)
Creatinine, Ser: <0.3 mg/dL — ABNORMAL LOW (ref 0.44–1.00)
Glucose, Bld: 122 mg/dL — ABNORMAL HIGH (ref 70–99)
Potassium: 3.9 mmol/L (ref 3.5–5.1)
Sodium: 137 mmol/L (ref 135–145)

## 2019-06-27 LAB — BLOOD GAS, ARTERIAL
Acid-Base Excess: 16 mmol/L — ABNORMAL HIGH (ref 0.0–2.0)
Bicarbonate: 41.6 mmol/L — ABNORMAL HIGH (ref 20.0–28.0)
FIO2: 100
O2 Saturation: 98.8 %
Patient temperature: 37
pCO2 arterial: 66.1 mmHg (ref 32.0–48.0)
pH, Arterial: 7.415 (ref 7.350–7.450)
pO2, Arterial: 115 mmHg — ABNORMAL HIGH (ref 83.0–108.0)

## 2019-06-27 MED ORDER — GENERIC EXTERNAL MEDICATION
Status: DC
Start: ? — End: 2019-06-27

## 2019-06-27 NOTE — Progress Notes (Signed)
Pulmonary [place]   PULMONARY CRITICAL CARE SERVICE  PROGRESS NOTE  Date of Service: 06/27/2019  Laurie Reeves  HFW:[id number]  DOB: [birth year]   DOA: 06/19/2019  Referring Physician: Merton Border, MD  HPI: Laurie Reeves is a 69 y.o. female seen for follow up of Acute on Chronic Respiratory Failure.  Patient has had a significant downturn overnight now is on 100% FiO2 and chest x-ray showing worsening of pneumonitis.  She is currently on pressure control mode with an FiO2 of 100% reviewed chest x-ray antibiotics have also been ordered.  Medications: Reviewed on Rounds  Physical Exam:  Vitals: Temperature 97.9 pulse 100 respiratory 25 blood pressure is 122/71 saturations 98%  Ventilator Settings mode of ventilation pressure assist control FiO2 100% tidal volume 428 PEEP 8 IP 30  . General: Comfortable at this time . Eyes: Grossly normal lids, irises & conjunctiva . ENT: grossly tongue is normal . Neck: no obvious mass . Cardiovascular: S1 S2 normal no gallop . Respiratory: No rhonchi coarse breath sounds are noted . Abdomen: soft . Skin: no rash seen on limited exam . Musculoskeletal: not rigid . Psychiatric:unable to assess . Neurologic: no seizure no involuntary movements         Lab Data:   Basic Metabolic Panel: Recent Labs  Lab 06/23/19 0444 06/25/19 0524 06/26/19 0710 06/27/19 1016  NA 141 140 138 137  K 3.6 3.6 4.0 3.9  CL 94* 89* 89* 87*  CO2 35* 41* 42* 38*  GLUCOSE 129* 140* 114* 122*  BUN 10 12 12 15   CREATININE <0.30* <0.30* <0.30* <0.30*  CALCIUM 9.2 9.0 9.1 9.0    ABG: No results for input(s): PHART, PCO2ART, PO2ART, HCO3, O2SAT in the last 168 hours.  Liver Function Tests: No results for input(s): AST, ALT, ALKPHOS, BILITOT, PROT, ALBUMIN in the last 168 hours. No results for input(s): LIPASE, AMYLASE in the last 168 hours. No results for input(s): AMMONIA in the last 168 hours.  CBC: Recent  Labs  Lab 06/23/19 0444 06/25/19 0524 06/27/19 1016  WBC 11.8* 9.7 8.5  HGB 9.1* 8.7* 8.1*  HCT 31.7* 31.6* 28.9*  MCV 86.8 87.8 86.5  PLT 376 359 347    Cardiac Enzymes: No results for input(s): CKTOTAL, CKMB, CKMBINDEX, TROPONINI in the last 168 hours.  BNP (last 3 results) No results for input(s): BNP in the last 8760 hours.  ProBNP (last 3 results) No results for input(s): PROBNP in the last 8760 hours.  Radiological Exams: DG CHEST PORT 1 VIEW  Result Date: 06/27/2019 CLINICAL DATA:  Respiratory failure EXAM: PORTABLE CHEST 1 VIEW COMPARISON:  06/20/2019 FINDINGS: Tracheostomy tube appears well positioned over the tracheal air column. Left PICC line tip: SVC. Bilateral perihilar and basilar airspace opacities noted. Improved aeration at the lung bases. Mildly reduced pleural thickening peripherally in the right mid chest. Heart size remains within normal limits. Persistent mild right lower paratracheal/hilar prominence, enlarged lymph nodes not excluded. IMPRESSION: 1. Bilateral perihilar and basilar airspace opacities, with improved aeration at the lung bases compared to 06/20/2019. 2. Mild right lower paratracheal/hilar prominence, enlarged lymph nodes not excluded. Electronically Signed   By: Van Clines M.D.   On: 06/27/2019 14:09    Assessment/Plan Principal Problem:   Acute on chronic respiratory failure with hypoxia [facility]) Active Problems:   Paroxysmal atrial fibrillation (HCC)   Severe sepsis (HCC)   Chronic HFrEF (heart failure with reduced ejection fraction) ([place])   COVID-19 virus infection   MSSA (methicillin  susceptible Staphylococcus aureus) pneumonia (HCC)   1. Acute on chronic respiratory failure hypoxia has had a significant decline likely secondary to underlying pneumonia.  Patient right now will be kept on full support and pressure control mode currently is on an FiO2 100% saturations are actually quite good I have asked for respiratory therapy to  adjust the PEEP and try to decrease the FiO2. 2. Paroxysmal atrial fibrillation right now rate controlled we will continue with supportive care. 3. MSSA pneumonia antibiotics will be broadened because of worsening infiltrates.  Will reassess with x-rays. 4. Severe sepsis right now hemodynamics are stable patient's antibiotics have been reviewed 5. Chronic heart failure reduced ejection fraction this should be kept in the differential also we will continue to monitor fluid status 6. COVID-19 virus infection this is actually resolved we will continue to monitor closely.   I have personally seen and evaluated the patient, evaluated laboratory and imaging results, formulated the assessment and plan and placed orders. The Patient requires high complexity decision making with multiple systems involvement.  Rounds were done with the Respiratory Therapy Director and Staff therapists and discussed with nursing staff also.  Time 35 minutes  Yevonne Pax, MD [facility] Pulmonary Critical Care Medicine Sleep Medicine

## 2019-06-28 DIAGNOSIS — U071 COVID-19: Secondary | ICD-10-CM | POA: Diagnosis not present

## 2019-06-28 DIAGNOSIS — I5022 Chronic systolic (congestive) heart failure: Secondary | ICD-10-CM | POA: Diagnosis not present

## 2019-06-28 DIAGNOSIS — J9621 Acute and chronic respiratory failure with hypoxia: Secondary | ICD-10-CM | POA: Diagnosis not present

## 2019-06-28 DIAGNOSIS — J15211 Pneumonia due to Methicillin susceptible Staphylococcus aureus: Secondary | ICD-10-CM | POA: Diagnosis not present

## 2019-06-28 LAB — URINALYSIS, ROUTINE W REFLEX MICROSCOPIC
Bacteria, UA: NONE SEEN
Bilirubin Urine: NEGATIVE
Glucose, UA: NEGATIVE mg/dL
Ketones, ur: NEGATIVE mg/dL
Leukocytes,Ua: NEGATIVE
Nitrite: NEGATIVE
Protein, ur: 30 mg/dL — AB
Specific Gravity, Urine: 1.018 (ref 1.005–1.030)
pH: 7 (ref 5.0–8.0)

## 2019-06-28 LAB — VANCOMYCIN, TROUGH: Vancomycin Tr: 22 ug/mL (ref 15–20)

## 2019-06-28 NOTE — Progress Notes (Signed)
Pulmonary [place]   PULMONARY CRITICAL CARE SERVICE  PROGRESS NOTE  Date of Service: 06/28/2019  Ariella Voit  ZOX:[id number]  DOB: 10-Jun-1950   DOA: 06/19/2019  Referring Physician: Merton Border, MD  HPI: Yolander Goodie is a 69 y.o. female seen for follow up of Acute on Chronic Respiratory Failure.  She continues to do poorly remains on assist control mode she was still on 100% FiO2 although her saturations 100%.  Spoke with respiratory therapy about weaning the FiO2 down.  Currently she is on a PEEP of 8.  Appears to be on otherwise in no distress.  Medications: Reviewed on Rounds  Physical Exam:  Vitals: Temperature 98.1 pulse 103 respiratory 21 blood pressure is 146/71 saturations 100%  Ventilator Settings on assist control FiO2 100% tidal volume 430 PEEP 8  . General: Comfortable at this time . Eyes: Grossly normal lids, irises & conjunctiva . ENT: grossly tongue is normal . Neck: no obvious mass . Cardiovascular: S1 S2 normal no gallop . Respiratory: No rhonchi coarse breath sounds are noted . Abdomen: soft . Skin: no rash seen on limited exam . Musculoskeletal: not rigid . Psychiatric:unable to assess . Neurologic: no seizure no involuntary movements         Lab Data:   Basic Metabolic Panel: Recent Labs  Lab 06/23/19 0444 06/25/19 0524 06/26/19 0710 06/27/19 1016  NA 141 140 138 137  K 3.6 3.6 4.0 3.9  CL 94* 89* 89* 87*  CO2 35* 41* 42* 38*  GLUCOSE 129* 140* 114* 122*  BUN 10 12 12 15   CREATININE <0.30* <0.30* <0.30* <0.30*  CALCIUM 9.2 9.0 9.1 9.0    ABG: Recent Labs  Lab 06/27/19 1415  PHART 7.415  PCO2ART 66.1*  PO2ART 115*  HCO3 41.6*  O2SAT 98.8    Liver Function Tests: No results for input(s): AST, ALT, ALKPHOS, BILITOT, PROT, ALBUMIN in the last 168 hours. No results for input(s): LIPASE, AMYLASE in the last 168 hours. No results for input(s): AMMONIA in the last 168  hours.  CBC: Recent Labs  Lab 06/23/19 0444 06/25/19 0524 06/27/19 1016  WBC 11.8* 9.7 8.5  HGB 9.1* 8.7* 8.1*  HCT 31.7* 31.6* 28.9*  MCV 86.8 87.8 86.5  PLT 376 359 347    Cardiac Enzymes: No results for input(s): CKTOTAL, CKMB, CKMBINDEX, TROPONINI in the last 168 hours.  BNP (last 3 results) No results for input(s): BNP in the last 8760 hours.  ProBNP (last 3 results) No results for input(s): PROBNP in the last 8760 hours.  Radiological Exams: DG CHEST PORT 1 VIEW  Result Date: 06/27/2019 CLINICAL DATA:  Respiratory failure EXAM: PORTABLE CHEST 1 VIEW COMPARISON:  06/20/2019 FINDINGS: Tracheostomy tube appears well positioned over the tracheal air column. Left PICC line tip: SVC. Bilateral perihilar and basilar airspace opacities noted. Improved aeration at the lung bases. Mildly reduced pleural thickening peripherally in the right mid chest. Heart size remains within normal limits. Persistent mild right lower paratracheal/hilar prominence, enlarged lymph nodes not excluded. IMPRESSION: 1. Bilateral perihilar and basilar airspace opacities, with improved aeration at the lung bases compared to 06/20/2019. 2. Mild right lower paratracheal/hilar prominence, enlarged lymph nodes not excluded. Electronically Signed   By: Van Clines M.D.   On: 06/27/2019 14:09    Assessment/Plan Principal Problem:   Acute on chronic respiratory failure with hypoxia [facility]) Active Problems:   Paroxysmal atrial fibrillation (HCC)   Severe sepsis (HCC)   Chronic HFrEF (heart failure with reduced  ejection fraction) (HCC)   COVID-19 virus infection   MSSA (methicillin susceptible Staphylococcus aureus) pneumonia (HCC)   1. Acute on chronic respiratory failure hypoxia chest x-ray showing bilateral increased airspace opacities some improvement in the lung bases from previous film.  Would try to decrease the FiO2 down as tolerated and continue with the PEEP of 8 right now. 2. Chronic heart  failure reduced ejection fraction plan is going to be to continue with monitoring fluid status diuretics as tolerated. 3. Severe sepsis hemodynamics are stable at this time. 4. Paroxysmal atrial fibrillation rate is controlled we will continue to follow 5. COVID-19 virus infection recovery phase 6. MSSA pneumonia patient has been reassessed for antibiotics and was started with antibiotics by the primary care team.   I have personally seen and evaluated the patient, evaluated laboratory and imaging results, formulated the assessment and plan and placed orders. The Patient requires high complexity decision making with multiple systems involvement.  Rounds were done with the Respiratory Therapy Director and Staff therapists and discussed with nursing staff also.  Patient critically ill in danger of cardiac arrest and death time 35 minutes  Yevonne Pax, MD [facility] Pulmonary Critical Care Medicine Sleep Medicine

## 2019-06-29 DIAGNOSIS — I5022 Chronic systolic (congestive) heart failure: Secondary | ICD-10-CM | POA: Diagnosis not present

## 2019-06-29 DIAGNOSIS — U071 COVID-19: Secondary | ICD-10-CM | POA: Diagnosis not present

## 2019-06-29 DIAGNOSIS — J9621 Acute and chronic respiratory failure with hypoxia: Secondary | ICD-10-CM | POA: Diagnosis not present

## 2019-06-29 DIAGNOSIS — J15211 Pneumonia due to Methicillin susceptible Staphylococcus aureus: Secondary | ICD-10-CM | POA: Diagnosis not present

## 2019-06-29 LAB — BLOOD GAS, ARTERIAL
Acid-Base Excess: 15.9 mmol/L — ABNORMAL HIGH (ref 0.0–2.0)
Bicarbonate: 41.1 mmol/L — ABNORMAL HIGH (ref 20.0–28.0)
Drawn by: 243969
FIO2: 70
O2 Saturation: 97.4 %
Patient temperature: 37.2
pCO2 arterial: 60.6 mmHg — ABNORMAL HIGH (ref 32.0–48.0)
pH, Arterial: 7.447 (ref 7.350–7.450)
pO2, Arterial: 92.6 mmHg (ref 83.0–108.0)

## 2019-06-29 LAB — CBC
HCT: 30.4 % — ABNORMAL LOW (ref 36.0–46.0)
Hemoglobin: 8.6 g/dL — ABNORMAL LOW (ref 12.0–15.0)
MCH: 25.2 pg — ABNORMAL LOW (ref 26.0–34.0)
MCHC: 28.3 g/dL — ABNORMAL LOW (ref 30.0–36.0)
MCV: 89.1 fL (ref 80.0–100.0)
Platelets: 290 10*3/uL (ref 150–400)
RBC: 3.41 MIL/uL — ABNORMAL LOW (ref 3.87–5.11)
RDW: 19.1 % — ABNORMAL HIGH (ref 11.5–15.5)
WBC: 11.2 10*3/uL — ABNORMAL HIGH (ref 4.0–10.5)
nRBC: 0 % (ref 0.0–0.2)

## 2019-06-29 LAB — BASIC METABOLIC PANEL
Anion gap: 12 (ref 5–15)
BUN: 24 mg/dL — ABNORMAL HIGH (ref 8–23)
CO2: 36 mmol/L — ABNORMAL HIGH (ref 22–32)
Calcium: 8.9 mg/dL (ref 8.9–10.3)
Chloride: 91 mmol/L — ABNORMAL LOW (ref 98–111)
Creatinine, Ser: 0.31 mg/dL — ABNORMAL LOW (ref 0.44–1.00)
GFR calc Af Amer: >60 mL/min (ref >60)
GFR calc non Af Amer: >60 mL/min (ref >60)
Glucose, Bld: 109 mg/dL — ABNORMAL HIGH (ref 70–99)
Potassium: 3.7 mmol/L (ref 3.5–5.1)
Sodium: 139 mmol/L (ref 135–145)

## 2019-06-29 LAB — URINE CULTURE: Culture: NO GROWTH

## 2019-06-29 LAB — VANCOMYCIN, TROUGH: Vancomycin Tr: 15 ug/mL (ref 15–20)

## 2019-06-29 LAB — TRIGLYCERIDES: Triglycerides: 145 mg/dL (ref <150)

## 2019-06-29 MED ORDER — GENERIC EXTERNAL MEDICATION
Status: DC
Start: ? — End: 2019-06-29

## 2019-06-29 NOTE — Progress Notes (Signed)
Pulmonary Critical Care Medicine [facility] [place]   PULMONARY CRITICAL CARE SERVICE  PROGRESS NOTE  Date of Service: 06/29/2019  [name]  VOZ:[id number]  DOB: Sep 12, 1950   DOA: 06/19/2019  Referring Physician: Carron Curie, MD  HPI: Laurie Reeves is a 69 y.o. female seen for follow up of Acute on Chronic Respiratory Failure.  Patient currently is on pressure assist control has been on 60% FiO2 right now with weaning the FiO2 down as tolerated so far she appears to be doing better currently is on a PEEP of 8  Medications: Reviewed on Rounds  Physical Exam:  Vitals: Temperature 98.4 pulse 98 respiratory 31 blood pressure is 110/58 saturations 100%  Ventilator Settings on pressure assist control FiO2 60% tidal volume 373 IP 20 PEEP 8  . General: Comfortable at this time . Eyes: Grossly normal lids, irises & conjunctiva . ENT: grossly tongue is normal . Neck: no obvious mass . Cardiovascular: S1 S2 normal no gallop . Respiratory: No rhonchi no rales are noted at this time . Abdomen: soft . Skin: no rash seen on limited exam . Musculoskeletal: not rigid . Psychiatric:unable to assess . Neurologic: no seizure no involuntary movements         Lab Data:   Basic Metabolic Panel: Recent Labs  Lab 06/23/19 0444 06/25/19 0524 06/26/19 0710 06/27/19 1016 06/29/19 0554  NA 141 140 138 137 139  K 3.6 3.6 4.0 3.9 3.7  CL 94* 89* 89* 87* 91*  CO2 35* 41* 42* 38* 36*  GLUCOSE 129* 140* 114* 122* 109*  BUN 10 12 12 15  24*  CREATININE <0.30* <0.30* <0.30* <0.30* 0.31*  CALCIUM 9.2 9.0 9.1 9.0 8.9    ABG: Recent Labs  Lab 06/27/19 1415 06/28/19 2345  PHART 7.415 7.447  PCO2ART 66.1* 60.6*  PO2ART 115* 92.6  HCO3 41.6* 41.1*  O2SAT 98.8 97.4    Liver Function Tests: No results for input(s): AST, ALT, ALKPHOS, BILITOT, PROT, ALBUMIN in the last 168 hours. No results for input(s): LIPASE, AMYLASE in the last 168 hours. No results for input(s):  AMMONIA in the last 168 hours.  CBC: Recent Labs  Lab 06/23/19 0444 06/25/19 0524 06/27/19 1016  WBC 11.8* 9.7 8.5  HGB 9.1* 8.7* 8.1*  HCT 31.7* 31.6* 28.9*  MCV 86.8 87.8 86.5  PLT 376 359 347    Cardiac Enzymes: No results for input(s): CKTOTAL, CKMB, CKMBINDEX, TROPONINI in the last 168 hours.  BNP (last 3 results) No results for input(s): BNP in the last 8760 hours.  ProBNP (last 3 results) No results for input(s): PROBNP in the last 8760 hours.  Radiological Exams: DG CHEST PORT 1 VIEW  Result Date: 06/27/2019 CLINICAL DATA:  Respiratory failure EXAM: PORTABLE CHEST 1 VIEW COMPARISON:  06/20/2019 FINDINGS: Tracheostomy tube appears well positioned over the tracheal air column. Left PICC line tip: SVC. Bilateral perihilar and basilar airspace opacities noted. Improved aeration at the lung bases. Mildly reduced pleural thickening peripherally in the right mid chest. Heart size remains within normal limits. Persistent mild right lower paratracheal/hilar prominence, enlarged lymph nodes not excluded. IMPRESSION: 1. Bilateral perihilar and basilar airspace opacities, with improved aeration at the lung bases compared to 06/20/2019. 2. Mild right lower paratracheal/hilar prominence, enlarged lymph nodes not excluded. Electronically Signed   By: 08/20/2019 M.D.   On: 06/27/2019 14:09    Assessment/Plan Principal Problem:   Acute on chronic respiratory failure with hypoxia [facility]) Active Problems:   Paroxysmal atrial fibrillation (HCC)   Severe  sepsis (HCC)   Chronic HFrEF (heart failure with reduced ejection fraction) ([place])   COVID-19 virus infection   MSSA (methicillin susceptible Staphylococcus aureus) pneumonia ([place])   1. Acute on chronic respiratory failure with hypoxia plan is going to be to continue with full support on pressure control mode at this time we will titrate oxygen down as tolerated we will continue secretion management pulmonary toilet. 2. Severe sepsis  resolved hemodynamics are stable 3. Chronic heart failure reduced ejection fraction appears to be doing little bit better with fluid management 4. COVID-19 virus infection recovery phase 5. MSSA pneumonia treated clinically is improving we will continue with supportive care. 6. Paroxysmal atrial fibrillation rate is controlled we will continue to monitor   I have personally seen and evaluated the patient, evaluated laboratory and imaging results, formulated the assessment and plan and placed orders. The Patient requires high complexity decision making with multiple systems involvement.  Rounds were done with the Respiratory Therapy Director and Staff therapists and discussed with nursing staff also.  Allyne Gee, MD [facility] Pulmonary Critical Care Medicine Sleep Medicine

## 2019-06-30 DIAGNOSIS — J15211 Pneumonia due to Methicillin susceptible Staphylococcus aureus: Secondary | ICD-10-CM | POA: Diagnosis not present

## 2019-06-30 DIAGNOSIS — U071 COVID-19: Secondary | ICD-10-CM | POA: Diagnosis not present

## 2019-06-30 DIAGNOSIS — I5022 Chronic systolic (congestive) heart failure: Secondary | ICD-10-CM | POA: Diagnosis not present

## 2019-06-30 DIAGNOSIS — J9621 Acute and chronic respiratory failure with hypoxia: Secondary | ICD-10-CM | POA: Diagnosis not present

## 2019-06-30 NOTE — Progress Notes (Signed)
Pulmonary Critical Care Medicine [facility] [place]   PULMONARY CRITICAL CARE SERVICE  PROGRESS NOTE  Date of Service: 06/30/2019  Leonna Schlee  WUX:[id number]  DOB: 1950-10-31   DOA: 06/19/2019  Referring Physician: Carron Curie, MD  HPI: Kaniyah Lisby is a 69 y.o. female seen for follow up of Acute on Chronic Respiratory Failure.  Patient currently is on pressure support this morning she is somewhat tachypneic however so I think we will probably have to hold on making the T collar attempt today.  Her temperature is down overall hemodynamics are improving  Medications: Reviewed on Rounds  Physical Exam:  Vitals: Temperature 97.0 pulse 108 respiratory 28 blood pressure is 145/81 saturations 94%  Ventilator Settings on pressure support FiO2 is 40% tidal volume is 426 PEEP 8 pressure support 12  . General: Comfortable at this time . Eyes: Grossly normal lids, irises & conjunctiva . ENT: grossly tongue is normal . Neck: no obvious mass . Cardiovascular: S1 S2 normal no gallop . Respiratory: No rhonchi coarse breath sounds are noted at this time . Abdomen: soft . Skin: no rash seen on limited exam . Musculoskeletal: not rigid . Psychiatric:unable to assess . Neurologic: no seizure no involuntary movements         Lab Data:   Basic Metabolic Panel: Recent Labs  Lab 06/25/19 0524 06/26/19 0710 06/27/19 1016 06/29/19 0554  NA 140 138 137 139  K 3.6 4.0 3.9 3.7  CL 89* 89* 87* 91*  CO2 41* 42* 38* 36*  GLUCOSE 140* 114* 122* 109*  BUN 12 12 15  24*  CREATININE <0.30* <0.30* <0.30* 0.31*  CALCIUM 9.0 9.1 9.0 8.9    ABG: Recent Labs  Lab 06/27/19 1415 06/28/19 2345  PHART 7.415 7.447  PCO2ART 66.1* 60.6*  PO2ART 115* 92.6  HCO3 41.6* 41.1*  O2SAT 98.8 97.4    Liver Function Tests: No results for input(s): AST, ALT, ALKPHOS, BILITOT, PROT, ALBUMIN in the last 168 hours. No results for input(s): LIPASE, AMYLASE in the last 168 hours. No results  for input(s): AMMONIA in the last 168 hours.  CBC: Recent Labs  Lab 06/25/19 0524 06/27/19 1016 06/29/19 1228  WBC 9.7 8.5 11.2*  HGB 8.7* 8.1* 8.6*  HCT 31.6* 28.9* 30.4*  MCV 87.8 86.5 89.1  PLT 359 347 290    Cardiac Enzymes: No results for input(s): CKTOTAL, CKMB, CKMBINDEX, TROPONINI in the last 168 hours.  BNP (last 3 results) No results for input(s): BNP in the last 8760 hours.  ProBNP (last 3 results) No results for input(s): PROBNP in the last 8760 hours.  Radiological Exams: No results found.  Assessment/Plan Principal Problem:   Acute on chronic respiratory failure with hypoxia (HCC) Active Problems:   Paroxysmal atrial fibrillation (HCC)   Severe sepsis (HCC)   Chronic HFrEF (heart failure with reduced ejection fraction) (HCC)   COVID-19 virus infection   MSSA (methicillin susceptible Staphylococcus aureus) pneumonia (HCC)   1. Acute on chronic respiratory failure with hypoxia right now is tolerating pressure support need to continue to monitor her for tachypnea.  I would probably hold off on T collar attempts 2. Paroxysmal atrial fibrillation rate controlled 3. Severe sepsis resolved 4. Chronic heart failure reduced ejection fraction continue with present management 5. COVID-19 virus infection treated we will continue to monitor 6. MSSA pneumonia treated and is clinically improving   I have personally seen and evaluated the patient, evaluated laboratory and imaging results, formulated the assessment and plan and placed orders. The Patient  requires high complexity decision making with multiple systems involvement.  Rounds were done with the Respiratory Therapy Director and Staff therapists and discussed with nursing staff also.  Allyne Gee, MD [facility] Pulmonary Critical Care Medicine Sleep Medicine

## 2019-07-01 DIAGNOSIS — J15211 Pneumonia due to Methicillin susceptible Staphylococcus aureus: Secondary | ICD-10-CM | POA: Diagnosis not present

## 2019-07-01 DIAGNOSIS — I5022 Chronic systolic (congestive) heart failure: Secondary | ICD-10-CM | POA: Diagnosis not present

## 2019-07-01 DIAGNOSIS — J9621 Acute and chronic respiratory failure with hypoxia: Secondary | ICD-10-CM | POA: Diagnosis not present

## 2019-07-01 DIAGNOSIS — U071 COVID-19: Secondary | ICD-10-CM | POA: Diagnosis not present

## 2019-07-01 LAB — CULTURE, RESPIRATORY W GRAM STAIN

## 2019-07-01 LAB — VANCOMYCIN, TROUGH: Vancomycin Tr: 39 ug/mL (ref 15–20)

## 2019-07-01 MED ORDER — GENERIC EXTERNAL MEDICATION
Status: DC
Start: ? — End: 2019-07-01

## 2019-07-01 NOTE — Progress Notes (Signed)
Pulmonary Critical Care Medicine [facility] [place]   PULMONARY CRITICAL CARE SERVICE  PROGRESS NOTE  Date of Service: 07/01/2019  Laurie Reeves  WUJ:[id number]  DOB: 1950/07/05   DOA: 06/19/2019  Referring Physician: Carron Curie, MD  HPI: Laurie Reeves is a 69 y.o. female seen for follow up of Acute on Chronic Respiratory Failure.  She is doing well this morning has been on T collar currently on 50% FiO2 with a goal of 8 hours  Medications: Reviewed on Rounds  Physical Exam:  Vitals: Temperature is 98.2 pulse 115 respiratory rate 28 blood pressure is 153/74 saturations 97%  Ventilator Settings on T collar FiO2 is 50%  . General: Comfortable at this time . Eyes: Grossly normal lids, irises & conjunctiva . ENT: grossly tongue is normal . Neck: no obvious mass . Cardiovascular: S1 S2 normal no gallop . Respiratory: No rhonchi no rales are noted at this time . Abdomen: soft . Skin: no rash seen on limited exam . Musculoskeletal: not rigid . Psychiatric:unable to assess . Neurologic: no seizure no involuntary movements         Lab Data:   Basic Metabolic Panel: Recent Labs  Lab 06/25/19 0524 06/26/19 0710 06/27/19 1016 06/29/19 0554  NA 140 138 137 139  K 3.6 4.0 3.9 3.7  CL 89* 89* 87* 91*  CO2 41* 42* 38* 36*  GLUCOSE 140* 114* 122* 109*  BUN 12 12 15  24*  CREATININE <0.30* <0.30* <0.30* 0.31*  CALCIUM 9.0 9.1 9.0 8.9    ABG: Recent Labs  Lab 06/27/19 1415 06/28/19 2345  PHART 7.415 7.447  PCO2ART 66.1* 60.6*  PO2ART 115* 92.6  HCO3 41.6* 41.1*  O2SAT 98.8 97.4    Liver Function Tests: No results for input(s): AST, ALT, ALKPHOS, BILITOT, PROT, ALBUMIN in the last 168 hours. No results for input(s): LIPASE, AMYLASE in the last 168 hours. No results for input(s): AMMONIA in the last 168 hours.  CBC: Recent Labs  Lab 06/25/19 0524 06/27/19 1016 06/29/19 1228  WBC 9.7 8.5 11.2*  HGB 8.7* 8.1* 8.6*  HCT 31.6* 28.9* 30.4*  MCV 87.8  86.5 89.1  PLT 359 347 290    Cardiac Enzymes: No results for input(s): CKTOTAL, CKMB, CKMBINDEX, TROPONINI in the last 168 hours.  BNP (last 3 results) No results for input(s): BNP in the last 8760 hours.  ProBNP (last 3 results) No results for input(s): PROBNP in the last 8760 hours.  Radiological Exams: No results found.  Assessment/Plan Principal Problem:   Acute on chronic respiratory failure with hypoxia (HCC) Active Problems:   Paroxysmal atrial fibrillation (HCC)   Severe sepsis (HCC)   Chronic HFrEF (heart failure with reduced ejection fraction) (HCC)   COVID-19 virus infection   MSSA (methicillin susceptible Staphylococcus aureus) pneumonia (HCC)   1. Acute on chronic respiratory failure with hypoxia we will continue with T collar trials titrate oxygen down if patient is able to tolerate.  Today's goal is 8 hours 2. Paroxysmal atrial fibrillation rate is controlled 3. Severe sepsis resolved 4. Chronic heart failure reduced ejection fraction we will continue to monitor 5. COVID-19 virus infection treated improved 6. MSSA pneumonia treated we will continue to follow along   I have personally seen and evaluated the patient, evaluated laboratory and imaging results, formulated the assessment and plan and placed orders. The Patient requires high complexity decision making with multiple systems involvement.  Rounds were done with the Respiratory Therapy Director and Staff therapists and discussed with nursing staff also.  Allyne Gee, MD [facility] Pulmonary Critical Care Medicine Sleep Medicine

## 2019-07-02 DIAGNOSIS — I5022 Chronic systolic (congestive) heart failure: Secondary | ICD-10-CM | POA: Diagnosis not present

## 2019-07-02 DIAGNOSIS — U071 COVID-19: Secondary | ICD-10-CM | POA: Diagnosis not present

## 2019-07-02 DIAGNOSIS — J15211 Pneumonia due to Methicillin susceptible Staphylococcus aureus: Secondary | ICD-10-CM | POA: Diagnosis not present

## 2019-07-02 DIAGNOSIS — J9621 Acute and chronic respiratory failure with hypoxia: Secondary | ICD-10-CM | POA: Diagnosis not present

## 2019-07-02 LAB — BASIC METABOLIC PANEL
Anion gap: 8 (ref 5–15)
BUN: 40 mg/dL — ABNORMAL HIGH (ref 8–23)
CO2: 41 mmol/L — ABNORMAL HIGH (ref 22–32)
Calcium: 9.6 mg/dL (ref 8.9–10.3)
Chloride: 92 mmol/L — ABNORMAL LOW (ref 98–111)
Creatinine, Ser: 0.4 mg/dL — ABNORMAL LOW (ref 0.44–1.00)
GFR calc Af Amer: >60 mL/min (ref >60)
GFR calc non Af Amer: >60 mL/min (ref >60)
Glucose, Bld: 144 mg/dL — ABNORMAL HIGH (ref 70–99)
Potassium: 3.9 mmol/L (ref 3.5–5.1)
Sodium: 141 mmol/L (ref 135–145)

## 2019-07-02 LAB — CBC
HCT: 30.1 % — ABNORMAL LOW (ref 36.0–46.0)
Hemoglobin: 8.7 g/dL — ABNORMAL LOW (ref 12.0–15.0)
MCH: 25.3 pg — ABNORMAL LOW (ref 26.0–34.0)
MCHC: 28.9 g/dL — ABNORMAL LOW (ref 30.0–36.0)
MCV: 87.5 fL (ref 80.0–100.0)
Platelets: 244 10*3/uL (ref 150–400)
RBC: 3.44 MIL/uL — ABNORMAL LOW (ref 3.87–5.11)
RDW: 19.2 % — ABNORMAL HIGH (ref 11.5–15.5)
WBC: 10 10*3/uL (ref 4.0–10.5)
nRBC: 0 % (ref 0.0–0.2)

## 2019-07-02 LAB — TRIGLYCERIDES: Triglycerides: 151 mg/dL — ABNORMAL HIGH (ref <150)

## 2019-07-02 LAB — VANCOMYCIN, TROUGH: Vancomycin Tr: 15 ug/mL (ref 15–20)

## 2019-07-02 NOTE — Progress Notes (Signed)
Pulmonary Critical Care Medicine [facility] [place]   PULMONARY CRITICAL CARE SERVICE  PROGRESS NOTE  Date of Service: 07/02/2019  Laurie Reeves  DGL:[id number]  DOB: 1950/03/03   DOA: 06/19/2019  Referring Physician: Carron Curie, MD  HPI: Laurie Reeves is a 69 y.o. female seen for follow up of Acute on Chronic Respiratory Failure.  Patient was able to complete 7.5 ventilator settings on T collar but was noted to be tachypneic this morning and therefore was placed back on the ventilator.  Medications: Reviewed on Rounds  Physical Exam:  Vitals: Temperature 97.5 pulse 118 respiratory 34 blood pressure is 152/77 saturations 96%  Ventilator Settings on pressure assist control FiO2 45% IP 20 PEEP 5  . General: Comfortable at this time . Eyes: Grossly normal lids, irises & conjunctiva . ENT: grossly tongue is normal . Neck: no obvious mass . Cardiovascular: S1 S2 normal no gallop . Respiratory: No rhonchi coarse breath sounds are noted . Abdomen: soft . Skin: no rash seen on limited exam . Musculoskeletal: not rigid . Psychiatric:unable to assess . Neurologic: no seizure no involuntary movements         Lab Data:   Basic Metabolic Panel: Recent Labs  Lab 06/26/19 0710 06/27/19 1016 06/29/19 0554 07/02/19 0702  NA 138 137 139 141  K 4.0 3.9 3.7 3.9  CL 89* 87* 91* 92*  CO2 42* 38* 36* 41*  GLUCOSE 114* 122* 109* 144*  BUN 12 15 24* 40*  CREATININE <0.30* <0.30* 0.31* 0.40*  CALCIUM 9.1 9.0 8.9 9.6    ABG: Recent Labs  Lab 06/27/19 1415 06/28/19 2345  PHART 7.415 7.447  PCO2ART 66.1* 60.6*  PO2ART 115* 92.6  HCO3 41.6* 41.1*  O2SAT 98.8 97.4    Liver Function Tests: No results for input(s): AST, ALT, ALKPHOS, BILITOT, PROT, ALBUMIN in the last 168 hours. No results for input(s): LIPASE, AMYLASE in the last 168 hours. No results for input(s): AMMONIA in the last 168 hours.  CBC: Recent Labs  Lab 06/27/19 1016 06/29/19 1228  07/02/19 0702  WBC 8.5 11.2* 10.0  HGB 8.1* 8.6* 8.7*  HCT 28.9* 30.4* 30.1*  MCV 86.5 89.1 87.5  PLT 347 290 244    Cardiac Enzymes: No results for input(s): CKTOTAL, CKMB, CKMBINDEX, TROPONINI in the last 168 hours.  BNP (last 3 results) No results for input(s): BNP in the last 8760 hours.  ProBNP (last 3 results) No results for input(s): PROBNP in the last 8760 hours.  Radiological Exams: No results found.  Assessment/Plan Principal Problem:   Acute on chronic respiratory failure with hypoxia (HCC) Active Problems:   Paroxysmal atrial fibrillation (HCC)   Severe sepsis (HCC)   Chronic HFrEF (heart failure with reduced ejection fraction) (HCC)   COVID-19 virus infection   MSSA (methicillin susceptible Staphylococcus aureus) pneumonia (HCC)   1. Acute on chronic respiratory failure hypoxia we will continue with pressure control mode titrate oxygen continue secretion management supportive care 2. Paroxysmal atrial fibrillation rate controlled at this time 3. Severe sepsis hemodynamics are stable 4. Chronic heart failure reduced ejection fraction appears to be compensated follow-up x-ray would be recommended 5. COVID-19 virus infection resolved 6. MSSA pneumonia treated clinically improving   I have personally seen and evaluated the patient, evaluated laboratory and imaging results, formulated the assessment and plan and placed orders. The Patient requires high complexity decision making with multiple systems involvement.  Rounds were done with the Respiratory Therapy Director and Staff therapists and discussed with nursing staff also.  Allyne Gee, MD [facility] Pulmonary Critical Care Medicine Sleep Medicine

## 2019-07-03 ENCOUNTER — Other Ambulatory Visit (HOSPITAL_COMMUNITY): Payer: Medicare Other

## 2019-07-03 DIAGNOSIS — J15211 Pneumonia due to Methicillin susceptible Staphylococcus aureus: Secondary | ICD-10-CM | POA: Diagnosis not present

## 2019-07-03 DIAGNOSIS — I5022 Chronic systolic (congestive) heart failure: Secondary | ICD-10-CM | POA: Diagnosis not present

## 2019-07-03 DIAGNOSIS — U071 COVID-19: Secondary | ICD-10-CM | POA: Diagnosis not present

## 2019-07-03 DIAGNOSIS — J9621 Acute and chronic respiratory failure with hypoxia: Secondary | ICD-10-CM | POA: Diagnosis not present

## 2019-07-03 LAB — CULTURE, BLOOD (ROUTINE X 2)
Culture: NO GROWTH
Culture: NO GROWTH
Special Requests: ADEQUATE

## 2019-07-03 MED ORDER — GENERIC EXTERNAL MEDICATION
Status: DC
Start: ? — End: 2019-07-03

## 2019-07-03 NOTE — Progress Notes (Addendum)
Pulmonary Critical Care Medicine [facility] [place]   PULMONARY CRITICAL CARE SERVICE  PROGRESS NOTE  Date of Service: 07/03/2019  Laurie Reeves  PYP:[id number]  DOB: Mar 26, 1950   DOA: 06/19/2019  Referring Physician: Carron Curie, MD  HPI: Laurie Reeves is a 69 y.o. female seen for follow up of Acute on Chronic Respiratory Failure.  Patient was only able to do 30 minutes on 50% aerosol trach collar today is back on pressure control due to increased respiratory rate currently on 45% FiO2 satting well.  Medications: Reviewed on Rounds  Physical Exam:  Vitals: Pulse 113 respirations 33 BP 174/85 O2 sat 98% temp 96.9   Ventilator Settings pressure control rate of 15 and suppression of 20 PEEP of 8 with an FiO2 of 45%  . General: Comfortable at this time . Eyes: Grossly normal lids, irises & conjunctiva . ENT: grossly tongue is normal . Neck: no obvious mass . Cardiovascular: S1 S2 normal no gallop . Respiratory: No rales or rhonchi noted . Abdomen: soft . Skin: no rash seen on limited exam . Musculoskeletal: not rigid . Psychiatric:unable to assess . Neurologic: no seizure no involuntary movements         Lab Data:   Basic Metabolic Panel: Recent Labs  Lab 06/27/19 1016 06/29/19 0554 07/02/19 0702  NA 137 139 141  K 3.9 3.7 3.9  CL 87* 91* 92*  CO2 38* 36* 41*  GLUCOSE 122* 109* 144*  BUN 15 24* 40*  CREATININE <0.30* 0.31* 0.40*  CALCIUM 9.0 8.9 9.6    ABG: Recent Labs  Lab 06/27/19 1415 06/28/19 2345  PHART 7.415 7.447  PCO2ART 66.1* 60.6*  PO2ART 115* 92.6  HCO3 41.6* 41.1*  O2SAT 98.8 97.4    Liver Function Tests: No results for input(s): AST, ALT, ALKPHOS, BILITOT, PROT, ALBUMIN in the last 168 hours. No results for input(s): LIPASE, AMYLASE in the last 168 hours. No results for input(s): AMMONIA in the last 168 hours.  CBC: Recent Labs  Lab 06/27/19 1016 06/29/19 1228 07/02/19 0702  WBC 8.5 11.2* 10.0  HGB 8.1* 8.6* 8.7*   HCT 28.9* 30.4* 30.1*  MCV 86.5 89.1 87.5  PLT 347 290 244    Cardiac Enzymes: No results for input(s): CKTOTAL, CKMB, CKMBINDEX, TROPONINI in the last 168 hours.  BNP (last 3 results) No results for input(s): BNP in the last 8760 hours.  ProBNP (last 3 results) No results for input(s): PROBNP in the last 8760 hours.  Radiological Exams: DG CHEST PORT 1 VIEW  Result Date: 07/03/2019 CLINICAL DATA:  Sinus tachycardia. EXAM: PORTABLE CHEST 1 VIEW COMPARISON:  Radiographs 06/27/2019, 06/20/2019 and 09/18/2012. FINDINGS: 1106 hours. The tracheostomy and left arm PICC are unchanged. The heart size and mediastinal contours are stable. Left greater than right diffuse bilateral airspace opacities have not significantly changed over the last 6 days. There is no pneumothorax or significant pleural effusion. The bones appear unchanged. Telemetry leads overlie the chest. IMPRESSION: No significant change in left greater than right diffuse bilateral airspace opacities suspicious for multifocal pneumonia. Electronically Signed   By: Carey Bullocks M.D.   On: 07/03/2019 11:37    Assessment/Plan Principal Problem:   Acute on chronic respiratory failure with hypoxia (HCC) Active Problems:   Paroxysmal atrial fibrillation (HCC)   Severe sepsis (HCC)   Chronic HFrEF (heart failure with reduced ejection fraction) (HCC)   COVID-19 virus infection   MSSA (methicillin susceptible Staphylococcus aureus) pneumonia (HCC)  1. Acute on chronic respiratory failure hypoxia we will  continue with pressure control mode titrate oxygen continue secretion management supportive care 2. Paroxysmal atrial fibrillation rate controlled at this time 3. Severe sepsis hemodynamics are stable 4. Chronic heart failure reduced ejection fraction appears to be compensated follow-up x-ray would be recommended 5. COVID-19 virus infection resolved 6. MSSA pneumonia treated clinically improving  I have personally seen and  evaluated the patient, evaluated laboratory and imaging results, formulated the assessment and plan and placed orders. The Patient requires high complexity decision making with multiple systems involvement.  Rounds were done with the Respiratory Therapy Director and Staff therapists and discussed with nursing staff also.  Allyne Gee, MD [facility] Pulmonary Critical Care Medicine Sleep Medicine

## 2019-07-04 ENCOUNTER — Other Ambulatory Visit (HOSPITAL_COMMUNITY): Payer: Medicare Other

## 2019-07-04 DIAGNOSIS — J9621 Acute and chronic respiratory failure with hypoxia: Secondary | ICD-10-CM | POA: Diagnosis not present

## 2019-07-04 DIAGNOSIS — U071 COVID-19: Secondary | ICD-10-CM | POA: Diagnosis not present

## 2019-07-04 DIAGNOSIS — I5022 Chronic systolic (congestive) heart failure: Secondary | ICD-10-CM | POA: Diagnosis not present

## 2019-07-04 DIAGNOSIS — J15211 Pneumonia due to Methicillin susceptible Staphylococcus aureus: Secondary | ICD-10-CM | POA: Diagnosis not present

## 2019-07-04 LAB — CBC
HCT: 29.6 % — ABNORMAL LOW (ref 36.0–46.0)
Hemoglobin: 8.3 g/dL — ABNORMAL LOW (ref 12.0–15.0)
MCH: 24.9 pg — ABNORMAL LOW (ref 26.0–34.0)
MCHC: 28 g/dL — ABNORMAL LOW (ref 30.0–36.0)
MCV: 88.9 fL (ref 80.0–100.0)
Platelets: 250 10*3/uL (ref 150–400)
RBC: 3.33 MIL/uL — ABNORMAL LOW (ref 3.87–5.11)
RDW: 19.5 % — ABNORMAL HIGH (ref 11.5–15.5)
WBC: 14.2 10*3/uL — ABNORMAL HIGH (ref 4.0–10.5)
nRBC: 0 % (ref 0.0–0.2)

## 2019-07-04 LAB — BASIC METABOLIC PANEL
Anion gap: 7 (ref 5–15)
BUN: 39 mg/dL — ABNORMAL HIGH (ref 8–23)
CO2: 38 mmol/L — ABNORMAL HIGH (ref 22–32)
Calcium: 9.5 mg/dL (ref 8.9–10.3)
Chloride: 96 mmol/L — ABNORMAL LOW (ref 98–111)
Creatinine, Ser: 0.42 mg/dL — ABNORMAL LOW (ref 0.44–1.00)
GFR calc Af Amer: >60 mL/min (ref >60)
GFR calc non Af Amer: >60 mL/min (ref >60)
Glucose, Bld: 136 mg/dL — ABNORMAL HIGH (ref 70–99)
Potassium: 4.4 mmol/L (ref 3.5–5.1)
Sodium: 141 mmol/L (ref 135–145)

## 2019-07-04 LAB — MAGNESIUM: Magnesium: 1.8 mg/dL (ref 1.7–2.4)

## 2019-07-04 MED ORDER — IOHEXOL 350 MG/ML SOLN
80.0000 mL | Freq: Once | INTRAVENOUS | Status: AC | PRN
  Administered 2019-07-04: 75 mL via INTRAVENOUS

## 2019-07-04 NOTE — Progress Notes (Addendum)
Pulmonary Critical Care Medicine [facility] [place]   PULMONARY CRITICAL CARE SERVICE  PROGRESS NOTE  Date of Service: 07/04/2019  Laurie Reeves  FAO:[id number]  DOB: 1950/06/01   DOA: 06/19/2019  Referring Physician: Carron Curie, MD  HPI: Laurie Reeves is a 69 y.o. female seen for follow up of Acute on Chronic Respiratory Failure.  Patient is a 12-hour goal today on 50% aerosol trach collar satting well at this time.  Medications: Reviewed on Rounds  Physical Exam:  Vitals: Pulse 104 respirations 35 BP 110/58 O2 sat 95% temp 98.8  Ventilator Settings 50% ATC  . General: Comfortable at this time . Eyes: Grossly normal lids, irises & conjunctiva . ENT: grossly tongue is normal . Neck: no obvious mass . Cardiovascular: S1 S2 normal no gallop . Respiratory: No rales or rhonchi noted . Abdomen: soft . Skin: no rash seen on limited exam . Musculoskeletal: not rigid . Psychiatric:unable to assess . Neurologic: no seizure no involuntary movements         Lab Data:   Basic Metabolic Panel: Recent Labs  Lab 06/29/19 0554 07/02/19 0702 07/04/19 0229  NA 139 141 141  K 3.7 3.9 4.4  CL 91* 92* 96*  CO2 36* 41* 38*  GLUCOSE 109* 144* 136*  BUN 24* 40* 39*  CREATININE 0.31* 0.40* 0.42*  CALCIUM 8.9 9.6 9.5  MG  --   --  1.8    ABG: Recent Labs  Lab 06/28/19 2345  PHART 7.447  PCO2ART 60.6*  PO2ART 92.6  HCO3 41.1*  O2SAT 97.4    Liver Function Tests: No results for input(s): AST, ALT, ALKPHOS, BILITOT, PROT, ALBUMIN in the last 168 hours. No results for input(s): LIPASE, AMYLASE in the last 168 hours. No results for input(s): AMMONIA in the last 168 hours.  CBC: Recent Labs  Lab 06/29/19 1228 07/02/19 0702 07/04/19 0229  WBC 11.2* 10.0 14.2*  HGB 8.6* 8.7* 8.3*  HCT 30.4* 30.1* 29.6*  MCV 89.1 87.5 88.9  PLT 290 244 250    Cardiac Enzymes: No results for input(s): CKTOTAL, CKMB, CKMBINDEX, TROPONINI in the last 168 hours.  BNP  (last 3 results) No results for input(s): BNP in the last 8760 hours.  ProBNP (last 3 results) No results for input(s): PROBNP in the last 8760 hours.  Radiological Exams: CT ANGIO CHEST PE W OR WO CONTRAST  Result Date: 07/04/2019 CLINICAL DATA:  Bilateral infiltrates and history of prior COVID-19 positivity EXAM: CT ANGIOGRAPHY CHEST WITH CONTRAST TECHNIQUE: Multidetector CT imaging of the chest was performed using the standard protocol during bolus administration of intravenous contrast. Multiplanar CT image reconstructions and MIPs were obtained to evaluate the vascular anatomy. CONTRAST:  22mL OMNIPAQUE IOHEXOL 350 MG/ML SOLN COMPARISON:  Chest x-ray from the previous day. FINDINGS: Cardiovascular: Thoracic aorta demonstrates atherosclerotic calcifications without aneurysmal dilatation or dissection. Heart is at the upper limits of normal in size. The pulmonary artery shows a normal branching pattern without intraluminal filling defect to suggest pulmonary embolism. Mediastinum/Nodes: Thoracic inlet shows tracheostomy tube in satisfactory position. Right-sided PICC line is noted extending into the superior vena cava. Scattered small hilar and mediastinal lymph nodes are noted which are likely reactive in nature. The esophagus is within normal limits. Lungs/Pleura: Lungs are well aerated bilaterally. Patchy airspace opacities are identified in a somewhat confluent manner in the bases bilaterally. Tiny right pleural effusion is noted. Given the patient's clinical history these likely represent sequelae from prior COVID-19 infection. Superimposed acute bacterial pneumonia could not be  totally excluded. The overall appearance is similar to that seen on prior chest x-ray from the previous day as well as 06/27/2019. Upper Abdomen: Visualized upper abdomen shows no acute abnormality. Musculoskeletal: No acute bony abnormality is noted. Review of the MIP images confirms the above findings. IMPRESSION: Patchy  airspace opacities bilaterally similar to that seen on recent chest x-ray. This likely represents sequelae from the prior COVID-19 infection. Superimposed acute pneumonia however could not be totally excluded. No evidence of pulmonary emboli. Aortic Atherosclerosis (ICD10-I70.0). Electronically Signed   By: Inez Catalina M.D.   On: 07/04/2019 13:45   DG CHEST PORT 1 VIEW  Result Date: 07/03/2019 CLINICAL DATA:  Sinus tachycardia. EXAM: PORTABLE CHEST 1 VIEW COMPARISON:  Radiographs 06/27/2019, 06/20/2019 and 09/18/2012. FINDINGS: 1106 hours. The tracheostomy and left arm PICC are unchanged. The heart size and mediastinal contours are stable. Left greater than right diffuse bilateral airspace opacities have not significantly changed over the last 6 days. There is no pneumothorax or significant pleural effusion. The bones appear unchanged. Telemetry leads overlie the chest. IMPRESSION: No significant change in left greater than right diffuse bilateral airspace opacities suspicious for multifocal pneumonia. Electronically Signed   By: Richardean Sale M.D.   On: 07/03/2019 11:37    Assessment/Plan Principal Problem:   Acute on chronic respiratory failure with hypoxia (HCC) Active Problems:   Paroxysmal atrial fibrillation (HCC)   Severe sepsis (HCC)   Chronic HFrEF (heart failure with reduced ejection fraction) ([place])   COVID-19 virus infection   MSSA (methicillin susceptible Staphylococcus aureus) pneumonia ([place])   1. Acute on chronic respiratory failure hypoxia patient has a 12-hour goal of 50% aerosol trach collar will continue to wean per protocol.  Tinea supportive measures and pulmonary toilet. 2. Paroxysmal atrial fibrillation rate controlled at this time 3. Severe sepsis hemodynamics are stable 4. Chronic heart failure reduced ejection fraction appears to be compensated follow-up x-ray would be recommended 5. COVID-19 virus infection resolved 6. MSSA pneumonia treated clinically  improving   I have personally seen and evaluated the patient, evaluated laboratory and imaging results, formulated the assessment and plan and placed orders. The Patient requires high complexity decision making with multiple systems involvement.  Rounds were done with the Respiratory Therapy Director and Staff therapists and discussed with nursing staff also.  Allyne Gee, MD [facility] Pulmonary Critical Care Medicine Sleep Medicine

## 2019-07-05 DIAGNOSIS — J15211 Pneumonia due to Methicillin susceptible Staphylococcus aureus: Secondary | ICD-10-CM | POA: Diagnosis not present

## 2019-07-05 DIAGNOSIS — I5022 Chronic systolic (congestive) heart failure: Secondary | ICD-10-CM | POA: Diagnosis not present

## 2019-07-05 DIAGNOSIS — U071 COVID-19: Secondary | ICD-10-CM | POA: Diagnosis not present

## 2019-07-05 DIAGNOSIS — J9621 Acute and chronic respiratory failure with hypoxia: Secondary | ICD-10-CM | POA: Diagnosis not present

## 2019-07-05 LAB — TRIGLYCERIDES: Triglycerides: 143 mg/dL (ref <150)

## 2019-07-05 NOTE — Progress Notes (Addendum)
Pulmonary [place]   PULMONARY CRITICAL CARE SERVICE  PROGRESS NOTE  Date of Service: 07/05/2019  Bryanda Mikel  GQQ:[id number]  DOB: January 06, 1951   DOA: 06/19/2019  Referring Physician: Merton Border, MD  HPI: Laurie Reeves is a 69 y.o. female seen for follow up of Acute on Chronic Respiratory Failure.  Patient is a 16-hour goal today on 50% aerosol trach collar currently satting well no fever distress.  Medications: Reviewed on Rounds  Physical Exam:  Vitals: Pulse 896 respirations 24 BP 160/80 O2 sat 98% temp 98.1  Ventilator Settings 50% ATC  . General: Comfortable at this time . Eyes: Grossly normal lids, irises & conjunctiva . ENT: grossly tongue is normal . Neck: no obvious mass . Cardiovascular: S1 S2 normal no gallop . Respiratory: No rales or rhonchi noted . Abdomen: soft . Skin: no rash seen on limited exam . Musculoskeletal: not rigid . Psychiatric:unable to assess . Neurologic: no seizure no involuntary movements         Lab Data:   Basic Metabolic Panel: Recent Labs  Lab 06/29/19 0554 07/02/19 0702 07/04/19 0229  NA 139 141 141  K 3.7 3.9 4.4  CL 91* 92* 96*  CO2 36* 41* 38*  GLUCOSE 109* 144* 136*  BUN 24* 40* 39*  CREATININE 0.31* 0.40* 0.42*  CALCIUM 8.9 9.6 9.5  MG  --   --  1.8    ABG: Recent Labs  Lab 06/28/19 2345  PHART 7.447  PCO2ART 60.6*  PO2ART 92.6  HCO3 41.1*  O2SAT 97.4    Liver Function Tests: No results for input(s): AST, ALT, ALKPHOS, BILITOT, PROT, ALBUMIN in the last 168 hours. No results for input(s): LIPASE, AMYLASE in the last 168 hours. No results for input(s): AMMONIA in the last 168 hours.  CBC: Recent Labs  Lab 06/29/19 1228 07/02/19 0702 07/04/19 0229  WBC 11.2* 10.0 14.2*  HGB 8.6* 8.7* 8.3*  HCT 30.4* 30.1* 29.6*  MCV 89.1 87.5 88.9  PLT 290 244 250    Cardiac Enzymes: No results for input(s): CKTOTAL, CKMB, CKMBINDEX, TROPONINI in the last 168  hours.  BNP (last 3 results) No results for input(s): BNP in the last 8760 hours.  ProBNP (last 3 results) No results for input(s): PROBNP in the last 8760 hours.  Radiological Exams: CT ANGIO CHEST PE W OR WO CONTRAST  Result Date: 07/04/2019 CLINICAL DATA:  Bilateral infiltrates and history of prior COVID-19 positivity EXAM: CT ANGIOGRAPHY CHEST WITH CONTRAST TECHNIQUE: Multidetector CT imaging of the chest was performed using the standard protocol during bolus administration of intravenous contrast. Multiplanar CT image reconstructions and MIPs were obtained to evaluate the vascular anatomy. CONTRAST:  56mL OMNIPAQUE IOHEXOL 350 MG/ML SOLN COMPARISON:  Chest x-ray from the previous day. FINDINGS: Cardiovascular: Thoracic aorta demonstrates atherosclerotic calcifications without aneurysmal dilatation or dissection. Heart is at the upper limits of normal in size. The pulmonary artery shows a normal branching pattern without intraluminal filling defect to suggest pulmonary embolism. Mediastinum/Nodes: Thoracic inlet shows tracheostomy tube in satisfactory position. Right-sided PICC line is noted extending into the superior vena cava. Scattered small hilar and mediastinal lymph nodes are noted which are likely reactive in nature. The esophagus is within normal limits. Lungs/Pleura: Lungs are well aerated bilaterally. Patchy airspace opacities are identified in a somewhat confluent manner in the bases bilaterally. Tiny right pleural effusion is noted. Given the patient's clinical history these likely represent sequelae from prior COVID-19 infection. Superimposed acute bacterial pneumonia could not  be totally excluded. The overall appearance is similar to that seen on prior chest x-ray from the previous day as well as 06/27/2019. Upper Abdomen: Visualized upper abdomen shows no acute abnormality. Musculoskeletal: No acute bony abnormality is noted. Review of the MIP images confirms the above findings.  IMPRESSION: Patchy airspace opacities bilaterally similar to that seen on recent chest x-ray. This likely represents sequelae from the prior COVID-19 infection. Superimposed acute pneumonia however could not be totally excluded. No evidence of pulmonary emboli. Aortic Atherosclerosis (ICD10-I70.0). Electronically Signed   By: Alcide Clever M.D.   On: 07/04/2019 13:45    Assessment/Plan Principal Problem:   Acute on chronic respiratory failure with hypoxia (HCC) Active Problems:   Paroxysmal atrial fibrillation (HCC)   Severe sepsis (HCC)   Chronic HFrEF (heart failure with reduced ejection fraction) (HCC)   COVID-19 virus infection   MSSA (methicillin susceptible Staphylococcus aureus) pneumonia (HCC)   1. Acute on chronic respiratory failure hypoxia patient has a 16-hour goal of 50% aerosol trach collar will continue to wean per protocol.  Tinea supportive measures and pulmonary toilet. 2. Paroxysmal atrial fibrillation rate controlled at this time 3. Severe sepsis hemodynamics are stable 4. Chronic heart failure reduced ejection fraction appears to be compensated follow-up x-ray would be recommended 5. COVID-19 virus infection resolved 6. MSSA pneumonia treated clinically improving   I have personally seen and evaluated the patient, evaluated laboratory and imaging results, formulated the assessment and plan and placed orders. The Patient requires high complexity decision making with multiple systems involvement.  Rounds were done with the Respiratory Therapy Director and Staff therapists and discussed with nursing staff also.  Yevonne Pax, MD [facility] Pulmonary Critical Care Medicine Sleep Medicine

## 2019-07-06 DIAGNOSIS — J9621 Acute and chronic respiratory failure with hypoxia: Secondary | ICD-10-CM | POA: Diagnosis not present

## 2019-07-06 DIAGNOSIS — I5022 Chronic systolic (congestive) heart failure: Secondary | ICD-10-CM | POA: Diagnosis not present

## 2019-07-06 DIAGNOSIS — U071 COVID-19: Secondary | ICD-10-CM | POA: Diagnosis not present

## 2019-07-06 DIAGNOSIS — J15211 Pneumonia due to Methicillin susceptible Staphylococcus aureus: Secondary | ICD-10-CM | POA: Diagnosis not present

## 2019-07-06 NOTE — Progress Notes (Signed)
Pulmonary Critical Care Medicine [facility] [place]   PULMONARY CRITICAL CARE SERVICE  PROGRESS NOTE  Date of Service: 07/06/2019  Laurie Reeves  [id number]  DOB: 04/22/1950   DOA: 06/19/2019  Referring Physician: Carron Curie, MD  HPI: Laurie Reeves is a 69 y.o. female seen for follow up of Acute on Chronic Respiratory Failure.  Patient currently is on full support back on the ventilator right now is on pressure control mode.  She usually waits till later and we are willing to go back on the wean.  So today we are going to put her back on T collar with a goal of about 16 hours again  Medications: Reviewed on Rounds  Physical Exam:  Vitals: Temperature is 97.1 pulse 95 respiratory rate 18 blood pressure is 111/63 saturations 100%  Ventilator Settings on pressure assist control FiO2 50% tidal volume is 700 PEEP 8 IP 20  . General: Comfortable at this time . Eyes: Grossly normal lids, irises & conjunctiva . ENT: grossly tongue is normal . Neck: no obvious mass . Cardiovascular: S1 S2 normal no gallop . Respiratory: No rhonchi no rales are noted at this time . Abdomen: soft . Skin: no rash seen on limited exam . Musculoskeletal: not rigid . Psychiatric:unable to assess . Neurologic: no seizure no involuntary movements         Lab Data:   Basic Metabolic Panel: Recent Labs  Lab 07/02/19 0702 07/04/19 0229  NA 141 141  K 3.9 4.4  CL 92* 96*  CO2 41* 38*  GLUCOSE 144* 136*  BUN 40* 39*  CREATININE 0.40* 0.42*  CALCIUM 9.6 9.5  MG  --  1.8    ABG: No results for input(s): PHART, PCO2ART, PO2ART, HCO3, O2SAT in the last 168 hours.  Liver Function Tests: No results for input(s): AST, ALT, ALKPHOS, BILITOT, PROT, ALBUMIN in the last 168 hours. No results for input(s): LIPASE, AMYLASE in the last 168 hours. No results for input(s): AMMONIA in the last 168 hours.  CBC: Recent Labs  Lab 06/29/19 1228 07/02/19 0702 07/04/19 0229  WBC 11.2* 10.0  14.2*  HGB 8.6* 8.7* 8.3*  HCT 30.4* 30.1* 29.6*  MCV 89.1 87.5 88.9  PLT 290 244 250    Cardiac Enzymes: No results for input(s): CKTOTAL, CKMB, CKMBINDEX, TROPONINI in the last 168 hours.  BNP (last 3 results) No results for input(s): BNP in the last 8760 hours.  ProBNP (last 3 results) No results for input(s): PROBNP in the last 8760 hours.  Radiological Exams: CT ANGIO CHEST PE W OR WO CONTRAST  Result Date: 07/04/2019 CLINICAL DATA:  Bilateral infiltrates and history of prior COVID-19 positivity EXAM: CT ANGIOGRAPHY CHEST WITH CONTRAST TECHNIQUE: Multidetector CT imaging of the chest was performed using the standard protocol during bolus administration of intravenous contrast. Multiplanar CT image reconstructions and MIPs were obtained to evaluate the vascular anatomy. CONTRAST:  105mL OMNIPAQUE IOHEXOL 350 MG/ML SOLN COMPARISON:  Chest x-ray from the previous day. FINDINGS: Cardiovascular: Thoracic aorta demonstrates atherosclerotic calcifications without aneurysmal dilatation or dissection. Heart is at the upper limits of normal in size. The pulmonary artery shows a normal branching pattern without intraluminal filling defect to suggest pulmonary embolism. Mediastinum/Nodes: Thoracic inlet shows tracheostomy tube in satisfactory position. Right-sided PICC line is noted extending into the superior vena cava. Scattered small hilar and mediastinal lymph nodes are noted which are likely reactive in nature. The esophagus is within normal limits. Lungs/Pleura: Lungs are well aerated bilaterally. Patchy airspace opacities are identified  in a somewhat confluent manner in the bases bilaterally. Tiny right pleural effusion is noted. Given the patient's clinical history these likely represent sequelae from prior COVID-19 infection. Superimposed acute bacterial pneumonia could not be totally excluded. The overall appearance is similar to that seen on prior chest x-ray from the previous day as well as  06/27/2019. Upper Abdomen: Visualized upper abdomen shows no acute abnormality. Musculoskeletal: No acute bony abnormality is noted. Review of the MIP images confirms the above findings. IMPRESSION: Patchy airspace opacities bilaterally similar to that seen on recent chest x-ray. This likely represents sequelae from the prior COVID-19 infection. Superimposed acute pneumonia however could not be totally excluded. No evidence of pulmonary emboli. Aortic Atherosclerosis (ICD10-I70.0). Electronically Signed   By: Inez Catalina M.D.   On: 07/04/2019 13:45    Assessment/Plan Principal Problem:   Acute on chronic respiratory failure with hypoxia (HCC) Active Problems:   Paroxysmal atrial fibrillation (HCC)   Severe sepsis (HCC)   Chronic HFrEF (heart failure with reduced ejection fraction) ([place])   COVID-19 virus infection   MSSA (methicillin susceptible Staphylococcus aureus) pneumonia ([place])   1. Acute on chronic respiratory failure hypoxia we will continue with full support on pressure control patient right now is on 50% FiO2 good saturations are noted.  Tidal volumes are okay.  We will start weaning again this afternoon. 2. Paroxysmal atrial fibrillation rate is controlled 3. Severe sepsis resolved 4. Chronic heart failure reduced ejection fraction right now appears to be compensated chest x-ray still showing some patchy airspace disease likely related to COVID-19 5. COVID-19 virus infection resolved residual damage is noted on the chest films. 6. MSSA pneumonia treated we will continue to monitor closely   I have personally seen and evaluated the patient, evaluated laboratory and imaging results, formulated the assessment and plan and placed orders. The Patient requires high complexity decision making with multiple systems involvement.  Rounds were done with the Respiratory Therapy Director and Staff therapists and discussed with nursing staff also.  Allyne Gee, MD [facility] Pulmonary Critical  Care Medicine Sleep Medicine

## 2019-07-07 DIAGNOSIS — J15211 Pneumonia due to Methicillin susceptible Staphylococcus aureus: Secondary | ICD-10-CM | POA: Diagnosis not present

## 2019-07-07 DIAGNOSIS — J9621 Acute and chronic respiratory failure with hypoxia: Secondary | ICD-10-CM | POA: Diagnosis not present

## 2019-07-07 DIAGNOSIS — I5022 Chronic systolic (congestive) heart failure: Secondary | ICD-10-CM | POA: Diagnosis not present

## 2019-07-07 DIAGNOSIS — U071 COVID-19: Secondary | ICD-10-CM | POA: Diagnosis not present

## 2019-07-07 NOTE — Progress Notes (Signed)
Pulmonary Critical Care Medicine [facility] [place]   PULMONARY CRITICAL CARE SERVICE  PROGRESS NOTE  Date of Service: 07/07/2019  Laurie Reeves  [id number]  DOB: [birth year]   DOA: 06/19/2019  Referring Physician: Carron Curie, MD  HPI: Laurie Reeves is a 69 y.o. female seen for follow up of Acute on Chronic Respiratory Failure.  Patient currently is on T collar has been on 40% FiO2 and the goal today will be for 24 hours  Medications: Reviewed on Rounds  Physical Exam:  Vitals: Temperature is 97.3 pulse 105 respiratory 20 blood pressure is 137/77 saturations 99%  Ventilator Settings off the ventilator on T collar with an FiO2 of 40%  . General: Comfortable at this time . Eyes: Grossly normal lids, irises & conjunctiva . ENT: grossly tongue is normal . Neck: no obvious mass . Cardiovascular: S1 S2 normal no gallop . Respiratory: No rhonchi coarse breath sounds are noted at this time . Abdomen: soft . Skin: no rash seen on limited exam . Musculoskeletal: not rigid . Psychiatric:unable to assess . Neurologic: no seizure no involuntary movements         Lab Data:   Basic Metabolic Panel: Recent Labs  Lab 07/02/19 0702 07/04/19 0229  NA 141 141  K 3.9 4.4  CL 92* 96*  CO2 41* 38*  GLUCOSE 144* 136*  BUN 40* 39*  CREATININE 0.40* 0.42*  CALCIUM 9.6 9.5  MG  --  1.8    ABG: No results for input(s): PHART, PCO2ART, PO2ART, HCO3, O2SAT in the last 168 hours.  Liver Function Tests: No results for input(s): AST, ALT, ALKPHOS, BILITOT, PROT, ALBUMIN in the last 168 hours. No results for input(s): LIPASE, AMYLASE in the last 168 hours. No results for input(s): AMMONIA in the last 168 hours.  CBC: Recent Labs  Lab 07/02/19 0702 07/04/19 0229  WBC 10.0 14.2*  HGB 8.7* 8.3*  HCT 30.1* 29.6*  MCV 87.5 88.9  PLT 244 250    Cardiac Enzymes: No results for input(s): CKTOTAL, CKMB, CKMBINDEX, TROPONINI in the last 168 hours.  BNP (last 3  results) No results for input(s): BNP in the last 8760 hours.  ProBNP (last 3 results) No results for input(s): PROBNP in the last 8760 hours.  Radiological Exams: No results found.  Assessment/Plan Principal Problem:   Acute on chronic respiratory failure with hypoxia (HCC) Active Problems:   Paroxysmal atrial fibrillation (HCC)   Severe sepsis (HCC)   Chronic HFrEF (heart failure with reduced ejection fraction) (HCC)   COVID-19 virus infection   MSSA (methicillin susceptible Staphylococcus aureus) pneumonia (HCC)   1. Acute on chronic respiratory failure with hypoxia we will continue with T collar patient remains on 40% FiO2 the goal is 24 hours  2. Paroxysmal atrial fibrillation rate controlled patient right now is on medical management 3. Severe sepsis resolved 4. Chronic heart failure reduced ejection fraction compensated we will continue to monitor 5. COVID-19 virus infection resolved 6. MSSA pneumonia treated clinically resolved   I have personally seen and evaluated the patient, evaluated laboratory and imaging results, formulated the assessment and plan and placed orders. The Patient requires high complexity decision making with multiple systems involvement.  Rounds were done with the Respiratory Therapy Director and Staff therapists and discussed with nursing staff also.  Yevonne Pax, MD [facility] Pulmonary Critical Care Medicine Sleep Medicine

## 2019-07-08 DIAGNOSIS — U071 COVID-19: Secondary | ICD-10-CM | POA: Diagnosis not present

## 2019-07-08 DIAGNOSIS — I5022 Chronic systolic (congestive) heart failure: Secondary | ICD-10-CM | POA: Diagnosis not present

## 2019-07-08 DIAGNOSIS — J15211 Pneumonia due to Methicillin susceptible Staphylococcus aureus: Secondary | ICD-10-CM | POA: Diagnosis not present

## 2019-07-08 DIAGNOSIS — J9621 Acute and chronic respiratory failure with hypoxia: Secondary | ICD-10-CM | POA: Diagnosis not present

## 2019-07-08 LAB — TRIGLYCERIDES: Triglycerides: 140 mg/dL (ref <150)

## 2019-07-08 NOTE — Progress Notes (Addendum)
Pulmonary Critical Care Medicine [facility] [place]   PULMONARY CRITICAL CARE SERVICE  PROGRESS NOTE  Date of Service: 07/08/2019  Laurie Reeves  [id number]  DOB: 1950/10/18   DOA: 06/19/2019  Referring Physician: Carron Curie, MD  HPI: Laurie Reeves is a 69 y.o. female seen for follow up of Acute on Chronic Respiratory Failure.  Patient remains on 30% aerosol trach collar this time satting well no distress.  Medications: Reviewed on Rounds  Physical Exam:  Vitals: Pulse 108 respirations 20 BP 135/77 O2 75% temp 97.0  Ventilator Settings 30% ATC  . General: Comfortable at this time . Eyes: Grossly normal lids, irises & conjunctiva . ENT: grossly tongue is normal . Neck: no obvious mass . Cardiovascular: S1 S2 normal no gallop . Respiratory: No rales or rhonchi noted . Abdomen: soft . Skin: no rash seen on limited exam . Musculoskeletal: not rigid . Psychiatric:unable to assess . Neurologic: no seizure no involuntary movements         Lab Data:   Basic Metabolic Panel: Recent Labs  Lab 07/02/19 0702 07/04/19 0229  NA 141 141  K 3.9 4.4  CL 92* 96*  CO2 41* 38*  GLUCOSE 144* 136*  BUN 40* 39*  CREATININE 0.40* 0.42*  CALCIUM 9.6 9.5  MG  --  1.8    ABG: No results for input(s): PHART, PCO2ART, PO2ART, HCO3, O2SAT in the last 168 hours.  Liver Function Tests: No results for input(s): AST, ALT, ALKPHOS, BILITOT, PROT, ALBUMIN in the last 168 hours. No results for input(s): LIPASE, AMYLASE in the last 168 hours. No results for input(s): AMMONIA in the last 168 hours.  CBC: Recent Labs  Lab 07/02/19 0702 07/04/19 0229  WBC 10.0 14.2*  HGB 8.7* 8.3*  HCT 30.1* 29.6*  MCV 87.5 88.9  PLT 244 250    Cardiac Enzymes: No results for input(s): CKTOTAL, CKMB, CKMBINDEX, TROPONINI in the last 168 hours.  BNP (last 3 results) No results for input(s): BNP in the last 8760 hours.  ProBNP (last 3 results) No results for input(s): PROBNP in  the last 8760 hours.  Radiological Exams: No results found.  Assessment/Plan Principal Problem:   Acute on chronic respiratory failure with hypoxia (HCC) Active Problems:   Paroxysmal atrial fibrillation (HCC)   Severe sepsis (HCC)   Chronic HFrEF (heart failure with reduced ejection fraction) (HCC)   COVID-19 virus infection   MSSA (methicillin susceptible Staphylococcus aureus) pneumonia (HCC)   1. Acute on chronic respiratory failure with hypoxia we will continue with T collar patient remains on 40% FiO2 the goal is 48 hours  2. Paroxysmal atrial fibrillation rate controlled patient right now is on medical management 3. Severe sepsis resolved 4. Chronic heart failure reduced ejection fraction compensated we will continue to monitor 5. COVID-19 virus infection resolved 6. MSSA pneumonia treated clinically resolved   I have personally seen and evaluated the patient, evaluated laboratory and imaging results, formulated the assessment and plan and placed orders. The Patient requires high complexity decision making with multiple systems involvement.  Rounds were done with the Respiratory Therapy Director and Staff therapists and discussed with nursing staff also.  Yevonne Pax, MD [facility] Pulmonary Critical Care Medicine Sleep Medicine

## 2019-07-09 DIAGNOSIS — J15211 Pneumonia due to Methicillin susceptible Staphylococcus aureus: Secondary | ICD-10-CM | POA: Diagnosis not present

## 2019-07-09 DIAGNOSIS — U071 COVID-19: Secondary | ICD-10-CM | POA: Diagnosis not present

## 2019-07-09 DIAGNOSIS — I5022 Chronic systolic (congestive) heart failure: Secondary | ICD-10-CM | POA: Diagnosis not present

## 2019-07-09 DIAGNOSIS — J9621 Acute and chronic respiratory failure with hypoxia: Secondary | ICD-10-CM | POA: Diagnosis not present

## 2019-07-09 NOTE — Progress Notes (Signed)
Pulmonary Critical Care Medicine [facility] [place]   PULMONARY CRITICAL CARE SERVICE  PROGRESS NOTE  Date of Service: 07/09/2019  Laurie Reeves  [id number]  DOB: 03/15/1950   DOA: 06/19/2019  Referring Physician: Carron Curie, MD  HPI: Laurie Reeves is a 69 y.o. female seen for follow up of Acute on Chronic Respiratory Failure.  Patient is weaning on T collar currently on 30% FiO2 with good saturations noted at this time.  Medications: Reviewed on Rounds  Physical Exam:  Vitals: Temperature is 98.8 pulse 108 respiratory rate 22 blood pressure is 141/81 saturations 95%  Ventilator Settings of the ventilator on T collar FiO2 30%  . General: Comfortable at this time . Eyes: Grossly normal lids, irises & conjunctiva . ENT: grossly tongue is normal . Neck: no obvious mass . Cardiovascular: S1 S2 normal no gallop . Respiratory: No rhonchi no rales are noted at this time . Abdomen: soft . Skin: no rash seen on limited exam . Musculoskeletal: not rigid . Psychiatric:unable to assess . Neurologic: no seizure no involuntary movements         Lab Data:   Basic Metabolic Panel: Recent Labs  Lab 07/04/19 0229  NA 141  K 4.4  CL 96*  CO2 38*  GLUCOSE 136*  BUN 39*  CREATININE 0.42*  CALCIUM 9.5  MG 1.8    ABG: No results for input(s): PHART, PCO2ART, PO2ART, HCO3, O2SAT in the last 168 hours.  Liver Function Tests: No results for input(s): AST, ALT, ALKPHOS, BILITOT, PROT, ALBUMIN in the last 168 hours. No results for input(s): LIPASE, AMYLASE in the last 168 hours. No results for input(s): AMMONIA in the last 168 hours.  CBC: Recent Labs  Lab 07/04/19 0229  WBC 14.2*  HGB 8.3*  HCT 29.6*  MCV 88.9  PLT 250    Cardiac Enzymes: No results for input(s): CKTOTAL, CKMB, CKMBINDEX, TROPONINI in the last 168 hours.  BNP (last 3 results) No results for input(s): BNP in the last 8760 hours.  ProBNP (last 3 results) No results for input(s):  PROBNP in the last 8760 hours.  Radiological Exams: No results found.  Assessment/Plan Principal Problem:   Acute on chronic respiratory failure with hypoxia (HCC) Active Problems:   Paroxysmal atrial fibrillation (HCC)   Severe sepsis (HCC)   Chronic HFrEF (heart failure with reduced ejection fraction) (HCC)   COVID-19 virus infection   MSSA (methicillin susceptible Staphylococcus aureus) pneumonia (HCC)   1. Acute on chronic respiratory failure hypoxia plan is to continue with T collar trials wean FiO2 as tolerated continue aggressive pulmonary toilet. 2. Paroxysmal atrial fibrillation rate is controlled 3. Severe sepsis resolved 4. Chronic heart failure reduced ejection fraction we will continue to monitor fluid status 5. COVID-19 virus infection resolved 6. MSSA pneumonia treated we will continue to monitor closely.   I have personally seen and evaluated the patient, evaluated laboratory and imaging results, formulated the assessment and plan and placed orders. The Patient requires high complexity decision making with multiple systems involvement.  Rounds were done with the Respiratory Therapy Director and Staff therapists and discussed with nursing staff also.  Yevonne Pax, MD [facility] Pulmonary Critical Care Medicine Sleep Medicine

## 2019-07-10 DIAGNOSIS — I5022 Chronic systolic (congestive) heart failure: Secondary | ICD-10-CM | POA: Diagnosis not present

## 2019-07-10 DIAGNOSIS — J15211 Pneumonia due to Methicillin susceptible Staphylococcus aureus: Secondary | ICD-10-CM | POA: Diagnosis not present

## 2019-07-10 DIAGNOSIS — J9621 Acute and chronic respiratory failure with hypoxia: Secondary | ICD-10-CM | POA: Diagnosis not present

## 2019-07-10 DIAGNOSIS — U071 COVID-19: Secondary | ICD-10-CM | POA: Diagnosis not present

## 2019-07-10 NOTE — Progress Notes (Addendum)
Pulmonary Critical Care Medicine [facility] [place]   PULMONARY CRITICAL CARE SERVICE  PROGRESS NOTE  Date of Service: 07/10/2019  Laurie Reeves  TOI:[id number]  DOB: 1950-06-02   DOA: 06/19/2019  Referring Physician: Carron Curie, MD  HPI: Laurie Reeves is a 69 y.o. female seen for follow up of Acute on Chronic Respiratory Failure.  Patient remains 35% aerosol trach collar using PMV with no difficulty satting well no distress.  Medications: Reviewed on Rounds  Physical Exam:  Vitals: Pulse 103 respirations 26 BP 113/70 O2 sat 96% temp 97.1  Ventilator Settings ATC 35%  . General: Comfortable at this time . Eyes: Grossly normal lids, irises & conjunctiva . ENT: grossly tongue is normal . Neck: no obvious mass . Cardiovascular: S1 S2 normal no gallop . Respiratory: No rales or rhonchi noted . Abdomen: soft . Skin: no rash seen on limited exam . Musculoskeletal: not rigid . Psychiatric:unable to assess . Neurologic: no seizure no involuntary movements         Lab Data:   Basic Metabolic Panel: Recent Labs  Lab 07/04/19 0229  NA 141  K 4.4  CL 96*  CO2 38*  GLUCOSE 136*  BUN 39*  CREATININE 0.42*  CALCIUM 9.5  MG 1.8    ABG: No results for input(s): PHART, PCO2ART, PO2ART, HCO3, O2SAT in the last 168 hours.  Liver Function Tests: No results for input(s): AST, ALT, ALKPHOS, BILITOT, PROT, ALBUMIN in the last 168 hours. No results for input(s): LIPASE, AMYLASE in the last 168 hours. No results for input(s): AMMONIA in the last 168 hours.  CBC: Recent Labs  Lab 07/04/19 0229  WBC 14.2*  HGB 8.3*  HCT 29.6*  MCV 88.9  PLT 250    Cardiac Enzymes: No results for input(s): CKTOTAL, CKMB, CKMBINDEX, TROPONINI in the last 168 hours.  BNP (last 3 results) No results for input(s): BNP in the last 8760 hours.  ProBNP (last 3 results) No results for input(s): PROBNP in the last 8760 hours.  Radiological Exams: No results  found.  Assessment/Plan Principal Problem:   Acute on chronic respiratory failure with hypoxia (HCC) Active Problems:   Paroxysmal atrial fibrillation (HCC)   Severe sepsis (HCC)   Chronic HFrEF (heart failure with reduced ejection fraction) (HCC)   COVID-19 virus infection   MSSA (methicillin susceptible Staphylococcus aureus) pneumonia (HCC)   1. Acute on chronic respiratory failure hypoxia continue to wean on aerosol trach collar currently on 35% FiO2.  Janina Mayo was changed to #4 cuffless yesterday patient is doing well continue supportive measures and pulmonary toilet. 2. Paroxysmal atrial fibrillation rate is controlled 3. Severe sepsis resolved 4. Chronic heart failure reduced ejection fraction we will continue to monitor fluid status 5. COVID-19 virus infection resolved 6. MSSA pneumonia treated we will continue to monitor closely.   I have personally seen and evaluated the patient, evaluated laboratory and imaging results, formulated the assessment and plan and placed orders. The Patient requires high complexity decision making with multiple systems involvement.  Rounds were done with the Respiratory Therapy Director and Staff therapists and discussed with nursing staff also.  Yevonne Pax, MD [facility] Pulmonary Critical Care Medicine Sleep Medicine

## 2019-07-11 DIAGNOSIS — J9621 Acute and chronic respiratory failure with hypoxia: Secondary | ICD-10-CM | POA: Diagnosis not present

## 2019-07-11 DIAGNOSIS — I5022 Chronic systolic (congestive) heart failure: Secondary | ICD-10-CM | POA: Diagnosis not present

## 2019-07-11 DIAGNOSIS — J15211 Pneumonia due to Methicillin susceptible Staphylococcus aureus: Secondary | ICD-10-CM | POA: Diagnosis not present

## 2019-07-11 DIAGNOSIS — U071 COVID-19: Secondary | ICD-10-CM | POA: Diagnosis not present

## 2019-07-11 LAB — BASIC METABOLIC PANEL
Anion gap: 17 — ABNORMAL HIGH (ref 5–15)
BUN: 43 mg/dL — ABNORMAL HIGH (ref 8–23)
CO2: 33 mmol/L — ABNORMAL HIGH (ref 22–32)
Calcium: 9.8 mg/dL (ref 8.9–10.3)
Chloride: 93 mmol/L — ABNORMAL LOW (ref 98–111)
Creatinine, Ser: 0.5 mg/dL (ref 0.44–1.00)
GFR calc Af Amer: >60 mL/min (ref >60)
GFR calc non Af Amer: >60 mL/min (ref >60)
Glucose, Bld: 110 mg/dL — ABNORMAL HIGH (ref 70–99)
Potassium: 4.3 mmol/L (ref 3.5–5.1)
Sodium: 143 mmol/L (ref 135–145)

## 2019-07-11 LAB — CBC
HCT: 32.6 % — ABNORMAL LOW (ref 36.0–46.0)
Hemoglobin: 9.2 g/dL — ABNORMAL LOW (ref 12.0–15.0)
MCH: 24.4 pg — ABNORMAL LOW (ref 26.0–34.0)
MCHC: 28.2 g/dL — ABNORMAL LOW (ref 30.0–36.0)
MCV: 86.5 fL (ref 80.0–100.0)
Platelets: 242 10*3/uL (ref 150–400)
RBC: 3.77 MIL/uL — ABNORMAL LOW (ref 3.87–5.11)
RDW: 19.9 % — ABNORMAL HIGH (ref 11.5–15.5)
WBC: 9.2 10*3/uL (ref 4.0–10.5)
nRBC: 0 % (ref 0.0–0.2)

## 2019-07-11 LAB — TRIGLYCERIDES: Triglycerides: 279 mg/dL — ABNORMAL HIGH (ref <150)

## 2019-07-11 NOTE — Progress Notes (Signed)
Pulmonary Critical Care Medicine [facility] [place]   PULMONARY CRITICAL CARE SERVICE  PROGRESS NOTE  Date of Service: 07/11/2019  Laurie Reeves  JSH:[id number]  DOB: 1951/01/04   DOA: 06/19/2019  Referring Physician: Carron Curie, MD  HPI: Laurie Reeves is a 69 y.o. female seen for follow up of Acute on Chronic Respiratory Failure. Patient currently is on T collar has been on 40% FiO2 using the PMV. Appears to be comfortable right now without any distress  Medications: Reviewed on Rounds  Physical Exam:  Vitals: Temperature is 98.3 pulse 91 respiratory 24 blood pressure is 117/70 saturations 99%  Ventilator Settings off the ventilator on T collar FiO2 of 40%  . General: Comfortable at this time . Eyes: Grossly normal lids, irises & conjunctiva . ENT: grossly tongue is normal . Neck: no obvious mass . Cardiovascular: S1 S2 normal no gallop . Respiratory: No rhonchi coarse breath sounds are noted . Abdomen: soft . Skin: no rash seen on limited exam . Musculoskeletal: not rigid . Psychiatric:unable to assess . Neurologic: no seizure no involuntary movements         Lab Data:   Basic Metabolic Panel: Recent Labs  Lab 07/11/19 0626  NA 143  K 4.3  CL 93*  CO2 33*  GLUCOSE 110*  BUN 43*  CREATININE 0.50  CALCIUM 9.8    ABG: No results for input(s): PHART, PCO2ART, PO2ART, HCO3, O2SAT in the last 168 hours.  Liver Function Tests: No results for input(s): AST, ALT, ALKPHOS, BILITOT, PROT, ALBUMIN in the last 168 hours. No results for input(s): LIPASE, AMYLASE in the last 168 hours. No results for input(s): AMMONIA in the last 168 hours.  CBC: Recent Labs  Lab 07/11/19 0626  WBC 9.2  HGB 9.2*  HCT 32.6*  MCV 86.5  PLT 242    Cardiac Enzymes: No results for input(s): CKTOTAL, CKMB, CKMBINDEX, TROPONINI in the last 168 hours.  BNP (last 3 results) No results for input(s): BNP in the last 8760 hours.  ProBNP (last 3 results) No results  for input(s): PROBNP in the last 8760 hours.  Radiological Exams: No results found.  Assessment/Plan Principal Problem:   Acute on chronic respiratory failure with hypoxia (HCC) Active Problems:   Paroxysmal atrial fibrillation (HCC)   Severe sepsis (HCC)   Chronic HFrEF (heart failure with reduced ejection fraction) (HCC)   COVID-19 virus infection   MSSA (methicillin susceptible Staphylococcus aureus) pneumonia (HCC)   1. Acute on chronic respiratory failure with hypoxia plan is to continue with on T collar titrate oxygen down as tolerated. Continue secretion management supportive care. 2. Paroxysmal atrial fibrillation rate is controlled we will continue to follow along 3. Severe sepsis resolved supportive care 4. Chronic heart failure reduced EF monitoring fluid status 5. COVID-19 virus infection in recovery 6. MSSA pneumonia treated clinically improved   I have personally seen and evaluated the patient, evaluated laboratory and imaging results, formulated the assessment and plan and placed orders. The Patient requires high complexity decision making with multiple systems involvement.  Rounds were done with the Respiratory Therapy Director and Staff therapists and discussed with nursing staff also.  Yevonne Pax, MD [facility] Pulmonary Critical Care Medicine Sleep Medicine

## 2019-07-12 ENCOUNTER — Other Ambulatory Visit (HOSPITAL_COMMUNITY): Payer: Medicare Other

## 2019-07-12 DIAGNOSIS — U071 COVID-19: Secondary | ICD-10-CM | POA: Diagnosis not present

## 2019-07-12 DIAGNOSIS — J9621 Acute and chronic respiratory failure with hypoxia: Secondary | ICD-10-CM | POA: Diagnosis not present

## 2019-07-12 DIAGNOSIS — J15211 Pneumonia due to Methicillin susceptible Staphylococcus aureus: Secondary | ICD-10-CM | POA: Diagnosis not present

## 2019-07-12 DIAGNOSIS — I5022 Chronic systolic (congestive) heart failure: Secondary | ICD-10-CM | POA: Diagnosis not present

## 2019-07-12 NOTE — Progress Notes (Signed)
Pulmonary Critical Care Medicine [facility] [place]   PULMONARY CRITICAL CARE SERVICE  PROGRESS NOTE  Date of Service: 07/12/2019  Laurie Reeves  [id number]:[id number]  DOB: 08/21/1950   DOA: 06/19/2019  Referring Physician: Carron Curie, MD  HPI: Laurie Reeves is a 69 y.o. female seen for follow up of Acute on Chronic Respiratory Failure.  Patient at this time is doing well actually with the wean.  Has been on 35% FiO2 and is off the ventilator on T collar good saturations are noted.  Medications: Reviewed on Rounds  Physical Exam:  Vitals: Temperature is 98.1 pulse 98 respiratory rate 29 blood pressure is 121/67 saturations 98%  Ventilator Settings on T collar with an FiO2 of 35%  . General: Comfortable at this time . Eyes: Grossly normal lids, irises & conjunctiva . ENT: grossly tongue is normal . Neck: no obvious mass . Cardiovascular: S1 S2 normal no gallop . Respiratory: No rhonchi no rales are noted at this time . Abdomen: soft . Skin: no rash seen on limited exam . Musculoskeletal: not rigid . Psychiatric:unable to assess . Neurologic: no seizure no involuntary movements         Lab Data:   Basic Metabolic Panel: Recent Labs  Lab 07/11/19 0626  NA 143  K 4.3  CL 93*  CO2 33*  GLUCOSE 110*  BUN 43*  CREATININE 0.50  CALCIUM 9.8    ABG: No results for input(s): PHART, PCO2ART, PO2ART, HCO3, O2SAT in the last 168 hours.  Liver Function Tests: No results for input(s): AST, ALT, ALKPHOS, BILITOT, PROT, ALBUMIN in the last 168 hours. No results for input(s): LIPASE, AMYLASE in the last 168 hours. No results for input(s): AMMONIA in the last 168 hours.  CBC: Recent Labs  Lab 07/11/19 0626  WBC 9.2  HGB 9.2*  HCT 32.6*  MCV 86.5  PLT 242    Cardiac Enzymes: No results for input(s): CKTOTAL, CKMB, CKMBINDEX, TROPONINI in the last 168 hours.  BNP (last 3 results) No results for input(s): BNP in the last 8760 hours.  ProBNP (last 3  results) No results for input(s): PROBNP in the last 8760 hours.  Radiological Exams: DG Wrist 2 Views Right  Result Date: 07/12/2019 CLINICAL DATA:  Wrist pain EXAM: RIGHT WRIST - 2 VIEW COMPARISON:  None. FINDINGS: There is no evidence of fracture or dislocation. There is no evidence of arthropathy or other focal bone abnormality. Soft tissues are unremarkable. IMPRESSION: Negative. Electronically Signed   By: Gerome Sam III M.D   On: 07/12/2019 16:31    Assessment/Plan Principal Problem:   Acute on chronic respiratory failure with hypoxia (HCC) Active Problems:   Paroxysmal atrial fibrillation (HCC)   Severe sepsis (HCC)   Chronic HFrEF (heart failure with reduced ejection fraction) (HCC)   COVID-19 virus infection   MSSA (methicillin susceptible Staphylococcus aureus) pneumonia (HCC)   1. Acute on chronic respiratory failure hypoxia plan is to continue with the T collar weaning as tolerated titrate oxygen continue pulmonary toilet. 2. Paroxysmal atrial fibrillation rate controlled we will continue to monitor 3. Severe sepsis resolved 4. Chronic heart failure related to used ejection fraction we will continue to follow fluid status 5. COVID-19 virus infection resolved 6. MSSA pneumonia treated clinically improved   I have personally seen and evaluated the patient, evaluated laboratory and imaging results, formulated the assessment and plan and placed orders. The Patient requires high complexity decision making with multiple systems involvement.  Rounds were done with the Respiratory Therapy Director  and Staff therapists and discussed with nursing staff also.  Allyne Gee, MD [facility] Pulmonary Critical Care Medicine Sleep Medicine

## 2019-07-13 DIAGNOSIS — J15211 Pneumonia due to Methicillin susceptible Staphylococcus aureus: Secondary | ICD-10-CM | POA: Diagnosis not present

## 2019-07-13 DIAGNOSIS — J9621 Acute and chronic respiratory failure with hypoxia: Secondary | ICD-10-CM | POA: Diagnosis not present

## 2019-07-13 DIAGNOSIS — U071 COVID-19: Secondary | ICD-10-CM | POA: Diagnosis not present

## 2019-07-13 DIAGNOSIS — I5022 Chronic systolic (congestive) heart failure: Secondary | ICD-10-CM | POA: Diagnosis not present

## 2019-07-13 NOTE — Progress Notes (Signed)
Pulmonary Critical Care Medicine [facility] [place]   PULMONARY CRITICAL CARE SERVICE  PROGRESS NOTE  Date of Service: 07/13/2019  Laurie Reeves  CZY:[id number]  DOB: 1950/03/19   DOA: 06/19/2019  Referring Physician: Carron Curie, MD  HPI: Laurie Reeves is a 69 y.o. female seen for follow up of Acute on Chronic Respiratory Failure.  She is actually doing fairly well has been on T collar currently on 35% FiO2 using PMV will try to do capping trials today  Medications: Reviewed on Rounds  Physical Exam:  Vitals: Temperature is 97.4 pulse 98 respiratory rate 20 blood pressure is 113/71 saturations 94%  Ventilator Settings on T collar FiO2 is 35%  . General: Comfortable at this time . Eyes: Grossly normal lids, irises & conjunctiva . ENT: grossly tongue is normal . Neck: no obvious mass . Cardiovascular: S1 S2 normal no gallop . Respiratory: No rhonchi no rales are noted at this time . Abdomen: soft . Skin: no rash seen on limited exam . Musculoskeletal: not rigid . Psychiatric:unable to assess . Neurologic: no seizure no involuntary movements         Lab Data:   Basic Metabolic Panel: Recent Labs  Lab 07/11/19 0626  NA 143  K 4.3  CL 93*  CO2 33*  GLUCOSE 110*  BUN 43*  CREATININE 0.50  CALCIUM 9.8    ABG: No results for input(s): PHART, PCO2ART, PO2ART, HCO3, O2SAT in the last 168 hours.  Liver Function Tests: No results for input(s): AST, ALT, ALKPHOS, BILITOT, PROT, ALBUMIN in the last 168 hours. No results for input(s): LIPASE, AMYLASE in the last 168 hours. No results for input(s): AMMONIA in the last 168 hours.  CBC: Recent Labs  Lab 07/11/19 0626  WBC 9.2  HGB 9.2*  HCT 32.6*  MCV 86.5  PLT 242    Cardiac Enzymes: No results for input(s): CKTOTAL, CKMB, CKMBINDEX, TROPONINI in the last 168 hours.  BNP (last 3 results) No results for input(s): BNP in the last 8760 hours.  ProBNP (last 3 results) No results for input(s):  PROBNP in the last 8760 hours.  Radiological Exams: DG Wrist 2 Views Right  Result Date: 07/12/2019 CLINICAL DATA:  Wrist pain EXAM: RIGHT WRIST - 2 VIEW COMPARISON:  None. FINDINGS: There is no evidence of fracture or dislocation. There is no evidence of arthropathy or other focal bone abnormality. Soft tissues are unremarkable. IMPRESSION: Negative. Electronically Signed   By: Gerome Sam III M.D   On: 07/12/2019 16:31    Assessment/Plan Principal Problem:   Acute on chronic respiratory failure with hypoxia (HCC) Active Problems:   Paroxysmal atrial fibrillation (HCC)   Severe sepsis (HCC)   Chronic HFrEF (heart failure with reduced ejection fraction) (HCC)   COVID-19 virus infection   MSSA (methicillin susceptible Staphylococcus aureus) pneumonia (HCC)   1. Acute on chronic respiratory failure with hypoxia we will continue with T collar titrate oxygen continue pulmonary toilet. 2. Paroxysmal atrial fibrillation rate is controlled 3. Severe sepsis resolved 4. Chronic heart failure reduced ejection fraction continue supportive care 5. COVID-19 virus infection resolved 6. MSSA pneumonia clinically improved with   I have personally seen and evaluated the patient, evaluated laboratory and imaging results, formulated the assessment and plan and placed orders. The Patient requires high complexity decision making with multiple systems involvement.  Rounds were done with the Respiratory Therapy Director and Staff therapists and discussed with nursing staff also.  Yevonne Pax, MD [facility] Pulmonary Critical Care Medicine Sleep Medicine

## 2019-07-14 DIAGNOSIS — J9621 Acute and chronic respiratory failure with hypoxia: Secondary | ICD-10-CM | POA: Diagnosis not present

## 2019-07-14 DIAGNOSIS — U071 COVID-19: Secondary | ICD-10-CM | POA: Diagnosis not present

## 2019-07-14 DIAGNOSIS — J15211 Pneumonia due to Methicillin susceptible Staphylococcus aureus: Secondary | ICD-10-CM | POA: Diagnosis not present

## 2019-07-14 DIAGNOSIS — I5022 Chronic systolic (congestive) heart failure: Secondary | ICD-10-CM | POA: Diagnosis not present

## 2019-07-14 LAB — TRIGLYCERIDES: Triglycerides: 305 mg/dL — ABNORMAL HIGH (ref <150)

## 2019-07-14 NOTE — Progress Notes (Signed)
Pulmonary Critical Care Medicine [facility] [place]   PULMONARY CRITICAL CARE SERVICE  PROGRESS NOTE  Date of Service: 07/14/2019  Aaisha Sliter  VPX:[id number]  DOB: 20-Jul-1950   DOA: 06/19/2019  Referring Physician: Carron Curie, MD  HPI: Jerriyah Louis is a 69 y.o. female seen for follow up of Acute on Chronic Respiratory Failure.  Patient is capping at this time no new distress has been on 5 L oxygen completing 24 hours  Medications: Reviewed on Rounds  Physical Exam:  Vitals: Temperature 98.5 pulse 104 respiratory 24 blood pressure 137/55 saturations 98%  Ventilator Settings capping off the ventilator on 5 L  . General: Comfortable at this time . Eyes: Grossly normal lids, irises & conjunctiva . ENT: grossly tongue is normal . Neck: no obvious mass . Cardiovascular: S1 S2 normal no gallop . Respiratory: No rhonchi no rales are noted at this time . Abdomen: soft . Skin: no rash seen on limited exam . Musculoskeletal: not rigid . Psychiatric:unable to assess . Neurologic: no seizure no involuntary movements         Lab Data:   Basic Metabolic Panel: Recent Labs  Lab 07/11/19 0626  NA 143  K 4.3  CL 93*  CO2 33*  GLUCOSE 110*  BUN 43*  CREATININE 0.50  CALCIUM 9.8    ABG: No results for input(s): PHART, PCO2ART, PO2ART, HCO3, O2SAT in the last 168 hours.  Liver Function Tests: No results for input(s): AST, ALT, ALKPHOS, BILITOT, PROT, ALBUMIN in the last 168 hours. No results for input(s): LIPASE, AMYLASE in the last 168 hours. No results for input(s): AMMONIA in the last 168 hours.  CBC: Recent Labs  Lab 07/11/19 0626  WBC 9.2  HGB 9.2*  HCT 32.6*  MCV 86.5  PLT 242    Cardiac Enzymes: No results for input(s): CKTOTAL, CKMB, CKMBINDEX, TROPONINI in the last 168 hours.  BNP (last 3 results) No results for input(s): BNP in the last 8760 hours.  ProBNP (last 3 results) No results for input(s): PROBNP in the last 8760  hours.  Radiological Exams: DG Wrist 2 Views Right  Result Date: 07/12/2019 CLINICAL DATA:  Wrist pain EXAM: RIGHT WRIST - 2 VIEW COMPARISON:  None. FINDINGS: There is no evidence of fracture or dislocation. There is no evidence of arthropathy or other focal bone abnormality. Soft tissues are unremarkable. IMPRESSION: Negative. Electronically Signed   By: Gerome Sam III M.D   On: 07/12/2019 16:31    Assessment/Plan Principal Problem:   Acute on chronic respiratory failure with hypoxia (HCC) Active Problems:   Paroxysmal atrial fibrillation (HCC)   Severe sepsis (HCC)   Chronic HFrEF (heart failure with reduced ejection fraction) (HCC)   COVID-19 virus infection   MSSA (methicillin susceptible Staphylococcus aureus) pneumonia (HCC)   1. Acute on chronic respiratory failure hypoxia patient is doing well with capping trials on 5 L continue to advance 2. Paroxysmal atrial fibrillation rate is controlled 3. Chronic heart failure reduced ejection fraction monitor fluid status 4. COVID-19 virus infection recovery 5. MSSA pneumonia treated continue to support   I have personally seen and evaluated the patient, evaluated laboratory and imaging results, formulated the assessment and plan and placed orders. The Patient requires high complexity decision making with multiple systems involvement.  Rounds were done with the Respiratory Therapy Director and Staff therapists and discussed with nursing staff also.  Yevonne Pax, MD [facility] Pulmonary Critical Care Medicine Sleep Medicine

## 2019-07-15 DIAGNOSIS — J15211 Pneumonia due to Methicillin susceptible Staphylococcus aureus: Secondary | ICD-10-CM | POA: Diagnosis not present

## 2019-07-15 DIAGNOSIS — J9621 Acute and chronic respiratory failure with hypoxia: Secondary | ICD-10-CM | POA: Diagnosis not present

## 2019-07-15 DIAGNOSIS — I5022 Chronic systolic (congestive) heart failure: Secondary | ICD-10-CM | POA: Diagnosis not present

## 2019-07-15 DIAGNOSIS — U071 COVID-19: Secondary | ICD-10-CM | POA: Diagnosis not present

## 2019-07-15 NOTE — Progress Notes (Signed)
Pulmonary Critical Care Medicine [facility] [place]   PULMONARY CRITICAL CARE SERVICE  PROGRESS NOTE  Date of Service: 07/15/2019  Laurie Reeves  VVO:[id number]  DOB: [birth year]   DOA: 06/19/2019  Referring Physician: Carron Curie, MD  HPI: Laurie Reeves is a 69 y.o. female seen for follow up of Acute on Chronic Respiratory Failure.  Patient is capping currently is on 3 L today will be completing 48 hours  Medications: Reviewed on Rounds  Physical Exam:  Vitals: Temperature is 98.7 pulse 98 respiratory rate 30 blood pressure is 107/61 saturations 97%  Ventilator Settings capping of the ventilator on 3 L oxygen  . General: Comfortable at this time . Eyes: Grossly normal lids, irises & conjunctiva . ENT: grossly tongue is normal . Neck: no obvious mass . Cardiovascular: S1 S2 normal no gallop . Respiratory: No rhonchi no rales are noted at this time . Abdomen: soft . Skin: no rash seen on limited exam . Musculoskeletal: not rigid . Psychiatric:unable to assess . Neurologic: no seizure no involuntary movements         Lab Data:   Basic Metabolic Panel: Recent Labs  Lab 07/11/19 0626  NA 143  K 4.3  CL 93*  CO2 33*  GLUCOSE 110*  BUN 43*  CREATININE 0.50  CALCIUM 9.8    ABG: No results for input(s): PHART, PCO2ART, PO2ART, HCO3, O2SAT in the last 168 hours.  Liver Function Tests: No results for input(s): AST, ALT, ALKPHOS, BILITOT, PROT, ALBUMIN in the last 168 hours. No results for input(s): LIPASE, AMYLASE in the last 168 hours. No results for input(s): AMMONIA in the last 168 hours.  CBC: Recent Labs  Lab 07/11/19 0626  WBC 9.2  HGB 9.2*  HCT 32.6*  MCV 86.5  PLT 242    Cardiac Enzymes: No results for input(s): CKTOTAL, CKMB, CKMBINDEX, TROPONINI in the last 168 hours.  BNP (last 3 results) No results for input(s): BNP in the last 8760 hours.  ProBNP (last 3 results) No results for input(s): PROBNP in the last 8760  hours.  Radiological Exams: No results found.  Assessment/Plan Principal Problem:   Acute on chronic respiratory failure with hypoxia (HCC) Active Problems:   Paroxysmal atrial fibrillation (HCC)   Severe sepsis (HCC)   Chronic HFrEF (heart failure with reduced ejection fraction) (HCC)   COVID-19 virus infection   MSSA (methicillin susceptible Staphylococcus aureus) pneumonia (HCC)   1. Acute on chronic respiratory failure hypoxia continue with capping trials patient will be completing 48 hours 2. Paroxysmal atrial fibrillation rate is controlled we will continue to monitor 3. Severe sepsis resolved continue present management 4. Chronic heart failure reduced ejection fraction at baseline we will continue with supportive care 5. COVID-19 virus infection no change continue with supportive care 6. MSSA pneumonia treated we will continue present management   I have personally seen and evaluated the patient, evaluated laboratory and imaging results, formulated the assessment and plan and placed orders. The Patient requires high complexity decision making with multiple systems involvement.  Rounds were done with the Respiratory Therapy Director and Staff therapists and discussed with nursing staff also.  Yevonne Pax, MD [facility] Pulmonary Critical Care Medicine Sleep Medicine

## 2019-07-16 DIAGNOSIS — I5022 Chronic systolic (congestive) heart failure: Secondary | ICD-10-CM | POA: Diagnosis not present

## 2019-07-16 DIAGNOSIS — J15211 Pneumonia due to Methicillin susceptible Staphylococcus aureus: Secondary | ICD-10-CM | POA: Diagnosis not present

## 2019-07-16 DIAGNOSIS — J9621 Acute and chronic respiratory failure with hypoxia: Secondary | ICD-10-CM | POA: Diagnosis not present

## 2019-07-16 DIAGNOSIS — U071 COVID-19: Secondary | ICD-10-CM | POA: Diagnosis not present

## 2019-07-16 NOTE — Progress Notes (Signed)
Pulmonary Critical Care Medicine [facility] [place]   PULMONARY CRITICAL CARE SERVICE  PROGRESS NOTE  Date of Service: 07/16/2019  Jalan Bodi  EVO:[id number]  DOB: 17-Feb-1951   DOA: 06/19/2019  Referring Physician: Carron Curie, MD  HPI: Laurie Reeves is a 69 y.o. female seen for follow up of Acute on Chronic Respiratory Failure.  Patient is doing well with capping she has been capping over 72 hours.  Good saturations are noted.  Secretions are minimal reportedly.  She should be ready for decannulation  Medications: Reviewed on Rounds  Physical Exam:  Vitals: Temperature is 97.8 pulse 92 respiratory rate 22 blood pressure is 131/73 saturations 100%  Ventilator Settings capping off the ventilator  . General: Comfortable at this time . Eyes: Grossly normal lids, irises & conjunctiva . ENT: grossly tongue is normal . Neck: no obvious mass . Cardiovascular: S1 S2 normal no gallop . Respiratory: No rhonchi no rales are noted at this time . Abdomen: soft . Skin: no rash seen on limited exam . Musculoskeletal: not rigid . Psychiatric:unable to assess . Neurologic: no seizure no involuntary movements         Lab Data:   Basic Metabolic Panel: Recent Labs  Lab 07/11/19 0626  NA 143  K 4.3  CL 93*  CO2 33*  GLUCOSE 110*  BUN 43*  CREATININE 0.50  CALCIUM 9.8    ABG: No results for input(s): PHART, PCO2ART, PO2ART, HCO3, O2SAT in the last 168 hours.  Liver Function Tests: No results for input(s): AST, ALT, ALKPHOS, BILITOT, PROT, ALBUMIN in the last 168 hours. No results for input(s): LIPASE, AMYLASE in the last 168 hours. No results for input(s): AMMONIA in the last 168 hours.  CBC: Recent Labs  Lab 07/11/19 0626  WBC 9.2  HGB 9.2*  HCT 32.6*  MCV 86.5  PLT 242    Cardiac Enzymes: No results for input(s): CKTOTAL, CKMB, CKMBINDEX, TROPONINI in the last 168 hours.  BNP (last 3 results) No results for input(s): BNP in the last 8760  hours.  ProBNP (last 3 results) No results for input(s): PROBNP in the last 8760 hours.  Radiological Exams: No results found.  Assessment/Plan Principal Problem:   Acute on chronic respiratory failure with hypoxia (HCC) Active Problems:   Paroxysmal atrial fibrillation (HCC)   Severe sepsis (HCC)   Chronic HFrEF (heart failure with reduced ejection fraction) (HCC)   COVID-19 virus infection   MSSA (methicillin susceptible Staphylococcus aureus) pneumonia (HCC)   1. Acute on chronic respiratory failure hypoxia we will continue with advancing the weaning to proceed to decannulation 2. Paroxysmal atrial fibrillation rate is controlled 3. Severe sepsis resolved 4. Chronic heart failure reduced ejection fraction continue with supportive care 5. COVID-19 virus infection resolved 6. MSSA pneumonia treated clinically improved   I have personally seen and evaluated the patient, evaluated laboratory and imaging results, formulated the assessment and plan and placed orders. The Patient requires high complexity decision making with multiple systems involvement.  Rounds were done with the Respiratory Therapy Director and Staff therapists and discussed with nursing staff also.  Yevonne Pax, MD [facility] Pulmonary Critical Care Medicine Sleep Medicine

## 2019-07-17 DIAGNOSIS — J15211 Pneumonia due to Methicillin susceptible Staphylococcus aureus: Secondary | ICD-10-CM | POA: Diagnosis not present

## 2019-07-17 DIAGNOSIS — I5022 Chronic systolic (congestive) heart failure: Secondary | ICD-10-CM | POA: Diagnosis not present

## 2019-07-17 DIAGNOSIS — U071 COVID-19: Secondary | ICD-10-CM | POA: Diagnosis not present

## 2019-07-17 DIAGNOSIS — J9621 Acute and chronic respiratory failure with hypoxia: Secondary | ICD-10-CM | POA: Diagnosis not present

## 2019-07-17 LAB — CBC
HCT: 33.7 % — ABNORMAL LOW (ref 36.0–46.0)
Hemoglobin: 9.8 g/dL — ABNORMAL LOW (ref 12.0–15.0)
MCH: 24.6 pg — ABNORMAL LOW (ref 26.0–34.0)
MCHC: 29.1 g/dL — ABNORMAL LOW (ref 30.0–36.0)
MCV: 84.5 fL (ref 80.0–100.0)
Platelets: 220 10*3/uL (ref 150–400)
RBC: 3.99 MIL/uL (ref 3.87–5.11)
RDW: 18.9 % — ABNORMAL HIGH (ref 11.5–15.5)
WBC: 9 10*3/uL (ref 4.0–10.5)
nRBC: 0 % (ref 0.0–0.2)

## 2019-07-17 LAB — BASIC METABOLIC PANEL
Anion gap: 13 (ref 5–15)
BUN: 53 mg/dL — ABNORMAL HIGH (ref 8–23)
CO2: 31 mmol/L (ref 22–32)
Calcium: 10.1 mg/dL (ref 8.9–10.3)
Chloride: 94 mmol/L — ABNORMAL LOW (ref 98–111)
Creatinine, Ser: 0.6 mg/dL (ref 0.44–1.00)
GFR calc Af Amer: >60 mL/min (ref >60)
GFR calc non Af Amer: >60 mL/min (ref >60)
Glucose, Bld: 134 mg/dL — ABNORMAL HIGH (ref 70–99)
Potassium: 3.9 mmol/L (ref 3.5–5.1)
Sodium: 138 mmol/L (ref 135–145)

## 2019-07-17 LAB — TRIGLYCERIDES: Triglycerides: 289 mg/dL — ABNORMAL HIGH (ref <150)

## 2019-07-17 NOTE — Progress Notes (Addendum)
Pulmonary Critical Care Medicine [facility] [place]   PULMONARY CRITICAL CARE SERVICE  PROGRESS NOTE  Date of Service: 07/17/2019  Laurie Reeves  ZOX:[id number]  DOB: 03-Sep-1950   DOA: 06/19/2019  Referring Physician: Carron Curie, MD  HPI: Laurie Reeves is a 69 y.o. female seen for follow up of Acute on Chronic Respiratory Failure.  Patient decannulated doing well at this time on room air satting well no distress.  Medications: Reviewed on Rounds  Physical Exam:  Vitals: Pulse 98 respirations 29 BP 140/81 O2 sat 100% temp 98.1  Ventilator Settings room air  . General: Comfortable at this time . Eyes: Grossly normal lids, irises & conjunctiva . ENT: grossly tongue is normal . Neck: no obvious mass . Cardiovascular: S1 S2 normal no gallop . Respiratory: No rales or rhonchi noted . Abdomen: soft . Skin: no rash seen on limited exam . Musculoskeletal: not rigid . Psychiatric:unable to assess . Neurologic: no seizure no involuntary movements         Lab Data:   Basic Metabolic Panel: Recent Labs  Lab 07/11/19 0626 07/17/19 0505  NA 143 138  K 4.3 3.9  CL 93* 94*  CO2 33* 31  GLUCOSE 110* 134*  BUN 43* 53*  CREATININE 0.50 0.60  CALCIUM 9.8 10.1    ABG: No results for input(s): PHART, PCO2ART, PO2ART, HCO3, O2SAT in the last 168 hours.  Liver Function Tests: No results for input(s): AST, ALT, ALKPHOS, BILITOT, PROT, ALBUMIN in the last 168 hours. No results for input(s): LIPASE, AMYLASE in the last 168 hours. No results for input(s): AMMONIA in the last 168 hours.  CBC: Recent Labs  Lab 07/11/19 0626 07/17/19 0505  WBC 9.2 9.0  HGB 9.2* 9.8*  HCT 32.6* 33.7*  MCV 86.5 84.5  PLT 242 220    Cardiac Enzymes: No results for input(s): CKTOTAL, CKMB, CKMBINDEX, TROPONINI in the last 168 hours.  BNP (last 3 results) No results for input(s): BNP in the last 8760 hours.  ProBNP (last 3 results) No results for input(s): PROBNP in the last  8760 hours.  Radiological Exams: No results found.  Assessment/Plan Principal Problem:   Acute on chronic respiratory failure with hypoxia (HCC) Active Problems:   Paroxysmal atrial fibrillation (HCC)   Severe sepsis (HCC)   Chronic HFrEF (heart failure with reduced ejection fraction) (HCC)   COVID-19 virus infection   MSSA (methicillin susceptible Staphylococcus aureus) pneumonia (HCC)   1. Acute on chronic respiratory failure hypoxia patient decannulated well on room air at this time satting well we will continue core measures and pulmonary toilet.  Decannulation 2. Paroxysmal atrial fibrillation rate is controlled 3. Severe sepsis resolved 4. Chronic heart failure reduced ejection fraction continue with supportive care 5. COVID-19 virus infection resolved 6. MSSA pneumonia treated clinically improved   I have personally seen and evaluated the patient, evaluated laboratory and imaging results, formulated the assessment and plan and placed orders. The Patient requires high complexity decision making with multiple systems involvement.  Rounds were done with the Respiratory Therapy Director and Staff therapists and discussed with nursing staff also.  Yevonne Pax, MD [facility] Pulmonary Critical Care Medicine Sleep Medicine

## 2019-07-18 DIAGNOSIS — J15211 Pneumonia due to Methicillin susceptible Staphylococcus aureus: Secondary | ICD-10-CM | POA: Diagnosis not present

## 2019-07-18 DIAGNOSIS — I5022 Chronic systolic (congestive) heart failure: Secondary | ICD-10-CM | POA: Diagnosis not present

## 2019-07-18 DIAGNOSIS — U071 COVID-19: Secondary | ICD-10-CM | POA: Diagnosis not present

## 2019-07-18 DIAGNOSIS — J9621 Acute and chronic respiratory failure with hypoxia: Secondary | ICD-10-CM | POA: Diagnosis not present

## 2019-07-18 NOTE — Progress Notes (Signed)
Pulmonary Critical Care Medicine [facility] [place]   PULMONARY CRITICAL CARE SERVICE  PROGRESS NOTE  Date of Service: 07/18/2019  Laurie Reeves  RUE:[id number]  DOB: 11-24-1950   DOA: 06/19/2019  Referring Physician: Carron Curie, MD  HPI: Laurie Reeves is a 69 y.o. female seen for follow up of Acute on Chronic Respiratory Failure.  Patient is on 3 L nasal cannula doing fairly well.  Good saturations are noted.  Medications: Reviewed on Rounds  Physical Exam:  Vitals: Temperature is 97.1 pulse 91 respiratory 21 blood pressure is 122/64 saturations 100%  Ventilator Settings on 3 L nasal cannula  . General: Comfortable at this time . Eyes: Grossly normal lids, irises & conjunctiva . ENT: grossly tongue is normal . Neck: no obvious mass . Cardiovascular: S1 S2 normal no gallop . Respiratory: No rhonchi rales noted at this time . Abdomen: soft . Skin: no rash seen on limited exam . Musculoskeletal: not rigid . Psychiatric:unable to assess . Neurologic: no seizure no involuntary movements         Lab Data:   Basic Metabolic Panel: Recent Labs  Lab 07/17/19 0505  NA 138  K 3.9  CL 94*  CO2 31  GLUCOSE 134*  BUN 53*  CREATININE 0.60  CALCIUM 10.1    ABG: No results for input(s): PHART, PCO2ART, PO2ART, HCO3, O2SAT in the last 168 hours.  Liver Function Tests: No results for input(s): AST, ALT, ALKPHOS, BILITOT, PROT, ALBUMIN in the last 168 hours. No results for input(s): LIPASE, AMYLASE in the last 168 hours. No results for input(s): AMMONIA in the last 168 hours.  CBC: Recent Labs  Lab 07/17/19 0505  WBC 9.0  HGB 9.8*  HCT 33.7*  MCV 84.5  PLT 220    Cardiac Enzymes: No results for input(s): CKTOTAL, CKMB, CKMBINDEX, TROPONINI in the last 168 hours.  BNP (last 3 results) No results for input(s): BNP in the last 8760 hours.  ProBNP (last 3 results) No results for input(s): PROBNP in the last 8760 hours.  Radiological Exams: No  results found.  Assessment/Plan Principal Problem:   Acute on chronic respiratory failure with hypoxia (HCC) Active Problems:   Paroxysmal atrial fibrillation (HCC)   Severe sepsis (HCC)   Chronic HFrEF (heart failure with reduced ejection fraction) (HCC)   COVID-19 virus infection   MSSA (methicillin susceptible Staphylococcus aureus) pneumonia (HCC)   1. Acute on chronic respiratory failure hypoxia we will continue with oxygen therapy supportive care titrate as tolerated continue pulmonary toilet 2. Paroxysmal atrial fibrillation rate is controlled we will continue to follow 3. Severe sepsis resolved 4. Chronic heart failure reduced ejection fraction monitoring fluid status compensated 5. COVID-19 virus infection resolved 6. MSSA pneumonia treated resolved   I have personally seen and evaluated the patient, evaluated laboratory and imaging results, formulated the assessment and plan and placed orders. The Patient requires high complexity decision making with multiple systems involvement.  Rounds were done with the Respiratory Therapy Director and Staff therapists and discussed with nursing staff also.  Yevonne Pax, MD [facility] Pulmonary Critical Care Medicine Sleep Medicine

## 2019-07-19 DIAGNOSIS — U071 COVID-19: Secondary | ICD-10-CM | POA: Diagnosis not present

## 2019-07-19 DIAGNOSIS — J9621 Acute and chronic respiratory failure with hypoxia: Secondary | ICD-10-CM | POA: Diagnosis not present

## 2019-07-19 DIAGNOSIS — I5022 Chronic systolic (congestive) heart failure: Secondary | ICD-10-CM | POA: Diagnosis not present

## 2019-07-19 DIAGNOSIS — J15211 Pneumonia due to Methicillin susceptible Staphylococcus aureus: Secondary | ICD-10-CM | POA: Diagnosis not present

## 2019-07-19 NOTE — Progress Notes (Signed)
Pulmonary Critical Care Medicine [facility] [place]   PULMONARY CRITICAL CARE SERVICE  PROGRESS NOTE  Date of Service: 07/19/2019  Laurie Reeves  DPO:[id number]  DOB: 04/14/1950   DOA: 06/19/2019  Referring Physician: Carron Curie, MD  HPI: Laurie Reeves is a 69 y.o. female seen for follow up of Acute on Chronic Respiratory Failure.  Patient currently is on 3 L oxygen on nasal cannula doing fairly well.  No significant desaturations noted at this time  Medications: Reviewed on Rounds  Physical Exam:  Vitals: Temperature is 97.3 pulse 98 respiratory rate 20 blood pressure is 104/81 saturations 97%  Ventilator Settings off the ventilator on nasal cannula good saturations  . General: Comfortable at this time . Eyes: Grossly normal lids, irises & conjunctiva . ENT: grossly tongue is normal . Neck: no obvious mass . Cardiovascular: S1 S2 normal no gallop . Respiratory: No rhonchi no rales are noted at this time . Abdomen: soft . Skin: no rash seen on limited exam . Musculoskeletal: not rigid . Psychiatric:unable to assess . Neurologic: no seizure no involuntary movements         Lab Data:   Basic Metabolic Panel: Recent Labs  Lab 07/17/19 0505  NA 138  K 3.9  CL 94*  CO2 31  GLUCOSE 134*  BUN 53*  CREATININE 0.60  CALCIUM 10.1    ABG: No results for input(s): PHART, PCO2ART, PO2ART, HCO3, O2SAT in the last 168 hours.  Liver Function Tests: No results for input(s): AST, ALT, ALKPHOS, BILITOT, PROT, ALBUMIN in the last 168 hours. No results for input(s): LIPASE, AMYLASE in the last 168 hours. No results for input(s): AMMONIA in the last 168 hours.  CBC: Recent Labs  Lab 07/17/19 0505  WBC 9.0  HGB 9.8*  HCT 33.7*  MCV 84.5  PLT 220    Cardiac Enzymes: No results for input(s): CKTOTAL, CKMB, CKMBINDEX, TROPONINI in the last 168 hours.  BNP (last 3 results) No results for input(s): BNP in the last 8760 hours.  ProBNP (last 3 results) No  results for input(s): PROBNP in the last 8760 hours.  Radiological Exams: No results found.  Assessment/Plan Principal Problem:   Acute on chronic respiratory failure with hypoxia (HCC) Active Problems:   Paroxysmal atrial fibrillation (HCC)   Severe sepsis (HCC)   Chronic HFrEF (heart failure with reduced ejection fraction) (HCC)   COVID-19 virus infection   MSSA (methicillin susceptible Staphylococcus aureus) pneumonia (HCC)   1. Acute on chronic respiratory failure with hypoxia patient still requiring some supplemental oxygen currently is on 3 L.  Will titrate down as tolerated continue secretion management supportive care. 2. Paroxysmal atrial fibrillation rate is controlled we will continue to follow. 3. Severe sepsis resolved hemodynamics are stable. 4. Chronic heart failure reduced ejection fraction we will continue to monitor closely. 5. COVID-19 virus infection resolved we will continue with supportive care 6. MSSA pneumonia treated clinically is improving we will continue with supportive care   I have personally seen and evaluated the patient, evaluated laboratory and imaging results, formulated the assessment and plan and placed orders. The Patient requires high complexity decision making with multiple systems involvement.  Rounds were done with the Respiratory Therapy Director and Staff therapists and discussed with nursing staff also.  Yevonne Pax, MD [facility] Pulmonary Critical Care Medicine Sleep Medicine

## 2019-07-20 DIAGNOSIS — I5022 Chronic systolic (congestive) heart failure: Secondary | ICD-10-CM | POA: Diagnosis not present

## 2019-07-20 DIAGNOSIS — J9621 Acute and chronic respiratory failure with hypoxia: Secondary | ICD-10-CM | POA: Diagnosis not present

## 2019-07-20 DIAGNOSIS — U071 COVID-19: Secondary | ICD-10-CM | POA: Diagnosis not present

## 2019-07-20 DIAGNOSIS — J15211 Pneumonia due to Methicillin susceptible Staphylococcus aureus: Secondary | ICD-10-CM | POA: Diagnosis not present

## 2019-07-20 LAB — TRIGLYCERIDES: Triglycerides: 387 mg/dL — ABNORMAL HIGH (ref <150)

## 2019-07-20 NOTE — Progress Notes (Signed)
Pulmonary Critical Care Medicine [facility] [place]   PULMONARY CRITICAL CARE SERVICE  PROGRESS NOTE  Date of Service: 07/20/2019  Laurie Reeves  [id number]  DOB: 1950-11-20   DOA: 06/19/2019  Referring Physician: Carron Curie, MD  HPI: Laurie Reeves is a 69 y.o. female seen for follow up of Acute on Chronic Respiratory Failure.  Patient is doing well on nasal cannula 3 L of oxygen good saturations are noted.  No major issues noted overnight.  Medications: Reviewed on Rounds  Physical Exam:  Vitals: Temperature is 98.0 pulse 96 respiratory 23 blood pressure is 134/77 saturations 99%  Ventilator Settings off the ventilator on 3 L  . General: Comfortable at this time . Eyes: Grossly normal lids, irises & conjunctiva . ENT: grossly tongue is normal . Neck: no obvious mass . Cardiovascular: S1 S2 normal no gallop . Respiratory: No rhonchi no rales are noted at this time . Abdomen: soft . Skin: no rash seen on limited exam . Musculoskeletal: not rigid . Psychiatric:unable to assess . Neurologic: no seizure no involuntary movements         Lab Data:   Basic Metabolic Panel: Recent Labs  Lab 07/17/19 0505  NA 138  K 3.9  CL 94*  CO2 31  GLUCOSE 134*  BUN 53*  CREATININE 0.60  CALCIUM 10.1    ABG: No results for input(s): PHART, PCO2ART, PO2ART, HCO3, O2SAT in the last 168 hours.  Liver Function Tests: No results for input(s): AST, ALT, ALKPHOS, BILITOT, PROT, ALBUMIN in the last 168 hours. No results for input(s): LIPASE, AMYLASE in the last 168 hours. No results for input(s): AMMONIA in the last 168 hours.  CBC: Recent Labs  Lab 07/17/19 0505  WBC 9.0  HGB 9.8*  HCT 33.7*  MCV 84.5  PLT 220    Cardiac Enzymes: No results for input(s): CKTOTAL, CKMB, CKMBINDEX, TROPONINI in the last 168 hours.  BNP (last 3 results) No results for input(s): BNP in the last 8760 hours.  ProBNP (last 3 results) No results for input(s): PROBNP in the  last 8760 hours.  Radiological Exams: No results found.  Assessment/Plan Principal Problem:   Acute on chronic respiratory failure with hypoxia (HCC) Active Problems:   Paroxysmal atrial fibrillation (HCC)   Severe sepsis (HCC)   Chronic HFrEF (heart failure with reduced ejection fraction) (HCC)   COVID-19 virus infection   MSSA (methicillin susceptible Staphylococcus aureus) pneumonia (HCC)   1. Acute on chronic respiratory failure with hypoxia we will continue with oxygen therapy as ordered continue secretion management pulmonary toilet 2. Paroxysmal atrial fibrillation rate is controlled 3. Severe sepsis resolved 4. Chronic heart failure reduced ejection fraction we will continue with supportive care. 5. COVID-19 virus infection resolved 6. MSSA pneumonia treated clinically is improved   I have personally seen and evaluated the patient, evaluated laboratory and imaging results, formulated the assessment and plan and placed orders. The Patient requires high complexity decision making with multiple systems involvement.  Rounds were done with the Respiratory Therapy Director and Staff therapists and discussed with nursing staff also.  Yevonne Pax, MD [facility] Pulmonary Critical Care Medicine Sleep Medicine

## 2019-07-21 DIAGNOSIS — J15211 Pneumonia due to Methicillin susceptible Staphylococcus aureus: Secondary | ICD-10-CM | POA: Diagnosis not present

## 2019-07-21 DIAGNOSIS — J9621 Acute and chronic respiratory failure with hypoxia: Secondary | ICD-10-CM | POA: Diagnosis not present

## 2019-07-21 DIAGNOSIS — I5022 Chronic systolic (congestive) heart failure: Secondary | ICD-10-CM | POA: Diagnosis not present

## 2019-07-21 DIAGNOSIS — U071 COVID-19: Secondary | ICD-10-CM | POA: Diagnosis not present

## 2019-07-21 NOTE — Progress Notes (Signed)
Pulmonary Critical Care Medicine [facility] [place]   PULMONARY CRITICAL CARE SERVICE  PROGRESS NOTE  Date of Service: 07/21/2019  Laurie Reeves  AJO:[id number]  DOB: [birth year]   DOA: 06/19/2019  Referring Physician: Carron Curie, MD  HPI: Laurie Reeves is a 69 y.o. female seen for follow up of Acute on Chronic Respiratory Failure.  Patient is afebrile comfortable without distress at this time has been on 2 L nasal cannula with good saturations.  Medications: Reviewed on Rounds  Physical Exam:  Vitals: Temperature is 97.3 pulse 90 respiratory 18 blood pressure is 125/78 saturations 98%  Ventilator Settings on 2 L nasal cannula  . General: Comfortable at this time . Eyes: Grossly normal lids, irises & conjunctiva . ENT: grossly tongue is normal . Neck: no obvious mass . Cardiovascular: S1 S2 normal no gallop . Respiratory: No rhonchi no rales are noted at this time . Abdomen: soft . Skin: no rash seen on limited exam . Musculoskeletal: not rigid . Psychiatric:unable to assess . Neurologic: no seizure no involuntary movements         Lab Data:   Basic Metabolic Panel: Recent Labs  Lab 07/17/19 0505  NA 138  K 3.9  CL 94*  CO2 31  GLUCOSE 134*  BUN 53*  CREATININE 0.60  CALCIUM 10.1    ABG: No results for input(s): PHART, PCO2ART, PO2ART, HCO3, O2SAT in the last 168 hours.  Liver Function Tests: No results for input(s): AST, ALT, ALKPHOS, BILITOT, PROT, ALBUMIN in the last 168 hours. No results for input(s): LIPASE, AMYLASE in the last 168 hours. No results for input(s): AMMONIA in the last 168 hours.  CBC: Recent Labs  Lab 07/17/19 0505  WBC 9.0  HGB 9.8*  HCT 33.7*  MCV 84.5  PLT 220    Cardiac Enzymes: No results for input(s): CKTOTAL, CKMB, CKMBINDEX, TROPONINI in the last 168 hours.  BNP (last 3 results) No results for input(s): BNP in the last 8760 hours.  ProBNP (last 3 results) No results for input(s): PROBNP in the last  8760 hours.  Radiological Exams: No results found.  Assessment/Plan Principal Problem:   Acute on chronic respiratory failure with hypoxia (HCC) Active Problems:   Paroxysmal atrial fibrillation (HCC)   Severe sepsis (HCC)   Chronic HFrEF (heart failure with reduced ejection fraction) (HCC)   COVID-19 virus infection   MSSA (methicillin susceptible Staphylococcus aureus) pneumonia (HCC)   1. Acute on chronic respiratory failure hypoxia we will continue with oxygen therapy at 2 L continue secretion management supportive care. 2. Paroxysmal atrial fibrillation rate is controlled 3. Severe sepsis resolved 4. Chronic heart failure reduced ejection fraction at baseline we will continue to follow 5. COVID-19 virus infection clinically improving we will continue to follow 6. MSSA pneumonia treated we will continue to follow   I have personally seen and evaluated the patient, evaluated laboratory and imaging results, formulated the assessment and plan and placed orders. The Patient requires high complexity decision making with multiple systems involvement.  Rounds were done with the Respiratory Therapy Director and Staff therapists and discussed with nursing staff also.  Yevonne Pax, MD [facility] Pulmonary Critical Care Medicine Sleep Medicine

## 2019-07-22 LAB — CBC
HCT: 44.3 % (ref 36.0–46.0)
Hemoglobin: 12.7 g/dL (ref 12.0–15.0)
MCH: 24.4 pg — ABNORMAL LOW (ref 26.0–34.0)
MCHC: 28.7 g/dL — ABNORMAL LOW (ref 30.0–36.0)
MCV: 85.2 fL (ref 80.0–100.0)
Platelets: 213 10*3/uL (ref 150–400)
RBC: 5.2 MIL/uL — ABNORMAL HIGH (ref 3.87–5.11)
RDW: 19.1 % — ABNORMAL HIGH (ref 11.5–15.5)
WBC: 8 10*3/uL (ref 4.0–10.5)
nRBC: 0 % (ref 0.0–0.2)

## 2019-07-22 LAB — BASIC METABOLIC PANEL
Anion gap: 14 (ref 5–15)
BUN: 50 mg/dL — ABNORMAL HIGH (ref 8–23)
CO2: 32 mmol/L (ref 22–32)
Calcium: 10.4 mg/dL — ABNORMAL HIGH (ref 8.9–10.3)
Chloride: 93 mmol/L — ABNORMAL LOW (ref 98–111)
Creatinine, Ser: 0.63 mg/dL (ref 0.44–1.00)
GFR calc Af Amer: >60 mL/min (ref >60)
GFR calc non Af Amer: >60 mL/min (ref >60)
Glucose, Bld: 135 mg/dL — ABNORMAL HIGH (ref 70–99)
Potassium: 4.2 mmol/L (ref 3.5–5.1)
Sodium: 139 mmol/L (ref 135–145)

## 2019-07-23 ENCOUNTER — Other Ambulatory Visit (HOSPITAL_COMMUNITY): Payer: Medicare Other

## 2019-07-23 DIAGNOSIS — J15211 Pneumonia due to Methicillin susceptible Staphylococcus aureus: Secondary | ICD-10-CM | POA: Diagnosis not present

## 2019-07-23 DIAGNOSIS — U071 COVID-19: Secondary | ICD-10-CM | POA: Diagnosis not present

## 2019-07-23 DIAGNOSIS — J9621 Acute and chronic respiratory failure with hypoxia: Secondary | ICD-10-CM | POA: Diagnosis not present

## 2019-07-23 DIAGNOSIS — I5022 Chronic systolic (congestive) heart failure: Secondary | ICD-10-CM | POA: Diagnosis not present

## 2019-07-23 LAB — TRIGLYCERIDES: Triglycerides: 483 mg/dL — ABNORMAL HIGH (ref <150)

## 2019-07-23 NOTE — Progress Notes (Signed)
Pulmonary Critical Care Medicine [facility] [place]   PULMONARY CRITICAL CARE SERVICE  PROGRESS NOTE  Date of Service: 07/23/2019  Laurie Reeves  PYP:[id number]  DOB: 05-13-1950   DOA: 06/19/2019  Referring Physician: Carron Curie, MD  HPI: Laurie Reeves is a 69 y.o. female seen for follow up of Acute on Chronic Respiratory Failure.  Patient is on 2 L nasal cannula appears to be comfortable.  Still having some issues with cough which is ongoing  Medications: Reviewed on Rounds  Physical Exam:  Vitals: Temperature is 97.2 pulse 95 respiratory rate 20 blood pressure is 117/74 saturations 96%  Ventilator Settings off the ventilator on 2 L nasal cannula  . General: Comfortable at this time . Eyes: Grossly normal lids, irises & conjunctiva . ENT: grossly tongue is normal . Neck: no obvious mass . Cardiovascular: S1 S2 normal no gallop . Respiratory: No rhonchi no rales noted at this time . Abdomen: soft . Skin: no rash seen on limited exam . Musculoskeletal: not rigid . Psychiatric:unable to assess . Neurologic: no seizure no involuntary movements         Lab Data:   Basic Metabolic Panel: Recent Labs  Lab 07/17/19 0505 07/22/19 0243  NA 138 139  K 3.9 4.2  CL 94* 93*  CO2 31 32  GLUCOSE 134* 135*  BUN 53* 50*  CREATININE 0.60 0.63  CALCIUM 10.1 10.4*    ABG: No results for input(s): PHART, PCO2ART, PO2ART, HCO3, O2SAT in the last 168 hours.  Liver Function Tests: No results for input(s): AST, ALT, ALKPHOS, BILITOT, PROT, ALBUMIN in the last 168 hours. No results for input(s): LIPASE, AMYLASE in the last 168 hours. No results for input(s): AMMONIA in the last 168 hours.  CBC: Recent Labs  Lab 07/17/19 0505 07/22/19 0243  WBC 9.0 8.0  HGB 9.8* 12.7  HCT 33.7* 44.3  MCV 84.5 85.2  PLT 220 213    Cardiac Enzymes: No results for input(s): CKTOTAL, CKMB, CKMBINDEX, TROPONINI in the last 168 hours.  BNP (last 3 results) No results for  input(s): BNP in the last 8760 hours.  ProBNP (last 3 results) No results for input(s): PROBNP in the last 8760 hours.  Radiological Exams: No results found.  Assessment/Plan Principal Problem:   Acute on chronic respiratory failure with hypoxia (HCC) Active Problems:   Paroxysmal atrial fibrillation (HCC)   Severe sepsis (HCC)   Chronic HFrEF (heart failure with reduced ejection fraction) (HCC)   COVID-19 virus infection   MSSA (methicillin susceptible Staphylococcus aureus) pneumonia (HCC)   1. Acute on chronic respiratory failure hypoxia patient continues with oxygen therapy 2 L.  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 Paroxysmal atrial fibrillation rate is controlled 3. Severe sepsis resolved we will continue to follow 4. Chronic heart failure reduced ejection fraction supportive care we will continue to follow 5. COVID-19 virus infection in recovery with residual damage to the lungs 6. MSSA pneumonia treated clinically improving   I have personally seen and evaluated the patient, evaluated laboratory and imaging results, formulated the assessment and plan and placed orders. The Patient requires high complexity decision making with multiple systems involvement.  Rounds were done with the Respiratory Therapy Director and Staff therapists and discussed with nursing staff also.  Yevonne Pax, MD [facility] Pulmonary Critical Care Medicine Sleep Medicine

## 2019-07-25 DIAGNOSIS — J9621 Acute and chronic respiratory failure with hypoxia: Secondary | ICD-10-CM | POA: Diagnosis not present

## 2019-07-25 DIAGNOSIS — U071 COVID-19: Secondary | ICD-10-CM | POA: Diagnosis not present

## 2019-07-25 DIAGNOSIS — J15211 Pneumonia due to Methicillin susceptible Staphylococcus aureus: Secondary | ICD-10-CM | POA: Diagnosis not present

## 2019-07-25 DIAGNOSIS — I5022 Chronic systolic (congestive) heart failure: Secondary | ICD-10-CM | POA: Diagnosis not present

## 2019-07-25 NOTE — Progress Notes (Signed)
Pulmonary Critical Care Medicine [facility] [place]   PULMONARY CRITICAL CARE SERVICE  PROGRESS NOTE  Date of Service: 07/25/2019  Laurie Reeves  HWE:[id number]  DOB: [birth year]   DOA: 06/19/2019  Referring Physician: Carron Curie, MD  HPI: Laurie Reeves is a 69 y.o. female seen for follow up of Acute on Chronic Respiratory Failure.  Patient is on 2 L nasal cannula doing fairly well no distress noted at this time  Medications: Reviewed on Rounds  Physical Exam:  Vitals: Temperature is 98.9 pulse 95 respiratory 16 blood pressure is 139/83 saturations 99%  Ventilator Settings patient is off the ventilator  . General: Comfortable at this time . Eyes: Grossly normal lids, irises & conjunctiva . ENT: grossly tongue is normal . Neck: no obvious mass . Cardiovascular: S1 S2 normal no gallop . Respiratory: No rhonchi no rales . Abdomen: soft . Skin: no rash seen on limited exam . Musculoskeletal: not rigid . Psychiatric:unable to assess . Neurologic: no seizure no involuntary movements         Lab Data:   Basic Metabolic Panel: Recent Labs  Lab 07/22/19 0243  NA 139  K 4.2  CL 93*  CO2 32  GLUCOSE 135*  BUN 50*  CREATININE 0.63  CALCIUM 10.4*    ABG: No results for input(s): PHART, PCO2ART, PO2ART, HCO3, O2SAT in the last 168 hours.  Liver Function Tests: No results for input(s): AST, ALT, ALKPHOS, BILITOT, PROT, ALBUMIN in the last 168 hours. No results for input(s): LIPASE, AMYLASE in the last 168 hours. No results for input(s): AMMONIA in the last 168 hours.  CBC: Recent Labs  Lab 07/22/19 0243  WBC 8.0  HGB 12.7  HCT 44.3  MCV 85.2  PLT 213    Cardiac Enzymes: No results for input(s): CKTOTAL, CKMB, CKMBINDEX, TROPONINI in the last 168 hours.  BNP (last 3 results) No results for input(s): BNP in the last 8760 hours.  ProBNP (last 3 results) No results for input(s): PROBNP in the last 8760 hours.  Radiological Exams: No results  found.  Assessment/Plan Principal Problem:   Acute on chronic respiratory failure with hypoxia (HCC) Active Problems:   Paroxysmal atrial fibrillation (HCC)   Severe sepsis (HCC)   Chronic HFrEF (heart failure with reduced ejection fraction) (HCC)   COVID-19 virus infection   MSSA (methicillin susceptible Staphylococcus aureus) pneumonia (HCC)   1. Acute on chronic respiratory failure hypoxia patient is doing fine with 2 L oxygen continue with some titrating oxygen down as tolerated 2. Paroxysmal atrial fibrillation rate controlled 3. Severe sepsis resolved 4. Chronic heart failure reduced ejection fraction supportive care monitor fluid status 5. COVID-19 in recovery we will continue supportive care 6. MsSA pneumonia treated   I have personally seen and evaluated the patient, evaluated laboratory and imaging results, formulated the assessment and plan and placed orders. The Patient requires high complexity decision making with multiple systems involvement.  Rounds were done with the Respiratory Therapy Director and Staff therapists and discussed with nursing staff also.  Yevonne Pax, MD [facility] Pulmonary Critical Care Medicine Sleep Medicine

## 2019-07-26 DIAGNOSIS — J9621 Acute and chronic respiratory failure with hypoxia: Secondary | ICD-10-CM | POA: Diagnosis not present

## 2019-07-26 DIAGNOSIS — I5022 Chronic systolic (congestive) heart failure: Secondary | ICD-10-CM | POA: Diagnosis not present

## 2019-07-26 DIAGNOSIS — U071 COVID-19: Secondary | ICD-10-CM | POA: Diagnosis not present

## 2019-07-26 DIAGNOSIS — J15211 Pneumonia due to Methicillin susceptible Staphylococcus aureus: Secondary | ICD-10-CM | POA: Diagnosis not present

## 2019-07-26 NOTE — Progress Notes (Signed)
Pulmonary Critical Care Medicine [facility] [place]   PULMONARY CRITICAL CARE SERVICE  PROGRESS NOTE  Date of Service: 07/26/2019  Laurie Reeves  YIF:[id number]  DOB: 02/08/1951   DOA: 06/19/2019  Referring Physician: Carron Curie, MD  HPI: Laurie Reeves is a 69 y.o. female seen for follow up of Acute on Chronic Respiratory Failure.  Patient currently is on nasal cannula doing fairly well  Medications: Reviewed on Rounds  Physical Exam:  Vitals: Temperature 97.6 pulse 88 respiratory 18 blood pressure 129/71 saturations 98%  Ventilator Settings nasal cannula  . General: Comfortable at this time . Eyes: Grossly normal lids, irises & conjunctiva . ENT: grossly tongue is normal . Neck: no obvious mass . Cardiovascular: S1 S2 normal no gallop . Respiratory: No rhonchi no rales noted at this time . Abdomen: soft . Skin: no rash seen on limited exam . Musculoskeletal: not rigid . Psychiatric:unable to assess . Neurologic: no seizure no involuntary movements         Lab Data:   Basic Metabolic Panel: Recent Labs  Lab 07/22/19 0243  NA 139  K 4.2  CL 93*  CO2 32  GLUCOSE 135*  BUN 50*  CREATININE 0.63  CALCIUM 10.4*    ABG: No results for input(s): PHART, PCO2ART, PO2ART, HCO3, O2SAT in the last 168 hours.  Liver Function Tests: No results for input(s): AST, ALT, ALKPHOS, BILITOT, PROT, ALBUMIN in the last 168 hours. No results for input(s): LIPASE, AMYLASE in the last 168 hours. No results for input(s): AMMONIA in the last 168 hours.  CBC: Recent Labs  Lab 07/22/19 0243  WBC 8.0  HGB 12.7  HCT 44.3  MCV 85.2  PLT 213    Cardiac Enzymes: No results for input(s): CKTOTAL, CKMB, CKMBINDEX, TROPONINI in the last 168 hours.  BNP (last 3 results) No results for input(s): BNP in the last 8760 hours.  ProBNP (last 3 results) No results for input(s): PROBNP in the last 8760 hours.  Radiological Exams: No results  found.  Assessment/Plan Principal Problem:   Acute on chronic respiratory failure with hypoxia (HCC) Active Problems:   Paroxysmal atrial fibrillation (HCC)   Severe sepsis (HCC)   Chronic HFrEF (heart failure with reduced ejection fraction) (HCC)   COVID-19 virus infection   MSSA (methicillin susceptible Staphylococcus aureus) pneumonia (HCC)   1. Acute on chronic respiratory failure hypoxia resolved patient now is on oxygen therapy via nasal cannula we will continue supportive care 2. Severe sepsis resolved 3. Chronic heart failure reduced EF supportive care 4. COVID-19 virus infection treated we will continue with supportive care 5. MSSA pneumonia treated we will monitor   I have personally seen and evaluated the patient, evaluated laboratory and imaging results, formulated the assessment and plan and placed orders. The Patient requires high complexity decision making with multiple systems involvement.  Rounds were done with the Respiratory Therapy Director and Staff therapists and discussed with nursing staff also.  Yevonne Pax, MD [facility] Pulmonary Critical Care Medicine Sleep Medicine

## 2019-07-29 LAB — CBC
HCT: 50.4 % — ABNORMAL HIGH (ref 36.0–46.0)
Hemoglobin: 14.7 g/dL (ref 12.0–15.0)
MCH: 24.8 pg — ABNORMAL LOW (ref 26.0–34.0)
MCHC: 29.2 g/dL — ABNORMAL LOW (ref 30.0–36.0)
MCV: 85.1 fL (ref 80.0–100.0)
Platelets: 204 10*3/uL (ref 150–400)
RBC: 5.92 MIL/uL — ABNORMAL HIGH (ref 3.87–5.11)
RDW: 20.4 % — ABNORMAL HIGH (ref 11.5–15.5)
WBC: 9.9 10*3/uL (ref 4.0–10.5)
nRBC: 0 % (ref 0.0–0.2)

## 2019-07-29 LAB — BASIC METABOLIC PANEL
Anion gap: 13 (ref 5–15)
BUN: 69 mg/dL — ABNORMAL HIGH (ref 8–23)
CO2: 31 mmol/L (ref 22–32)
Calcium: 10.7 mg/dL — ABNORMAL HIGH (ref 8.9–10.3)
Chloride: 104 mmol/L (ref 98–111)
Creatinine, Ser: 0.65 mg/dL (ref 0.44–1.00)
GFR calc Af Amer: >60 mL/min (ref >60)
GFR calc non Af Amer: >60 mL/min (ref >60)
Glucose, Bld: 133 mg/dL — ABNORMAL HIGH (ref 70–99)
Potassium: 3.8 mmol/L (ref 3.5–5.1)
Sodium: 148 mmol/L — ABNORMAL HIGH (ref 135–145)

## 2019-07-29 LAB — URINALYSIS, ROUTINE W REFLEX MICROSCOPIC
Bilirubin Urine: NEGATIVE
Glucose, UA: NEGATIVE mg/dL
Hgb urine dipstick: NEGATIVE
Ketones, ur: NEGATIVE mg/dL
Nitrite: NEGATIVE
Protein, ur: NEGATIVE mg/dL
Specific Gravity, Urine: 1.018 (ref 1.005–1.030)
pH: 5 (ref 5.0–8.0)

## 2019-07-29 LAB — TSH: TSH: 4.312 u[IU]/mL (ref 0.350–4.500)

## 2019-07-31 LAB — URINE CULTURE: Culture: 20000 — AB

## 2019-08-03 LAB — NOVEL CORONAVIRUS, NAA (HOSP ORDER, SEND-OUT TO REF LAB; TAT 18-24 HRS): SARS-CoV-2, NAA: DETECTED — AB

## 2019-08-04 ENCOUNTER — Other Ambulatory Visit (HOSPITAL_COMMUNITY): Payer: Medicare Other

## 2019-08-04 LAB — NOVEL CORONAVIRUS, NAA (HOSP ORDER, SEND-OUT TO REF LAB; TAT 18-24 HRS): SARS-CoV-2, NAA: NOT DETECTED

## 2021-02-11 IMAGING — CT CT ANGIO CHEST
2 of 7 series · 18 of 46 positions shown · IV contrast (omnipaque)
Comparison: Chest x-ray from the previous day.

CLINICAL DATA: Bilateral infiltrates and history of prior 9TQXM-8V
positivity

EXAM:
CT ANGIOGRAPHY CHEST WITH CONTRAST
TECHNIQUE: Multidetector CT imaging of the chest was performed using the
standard protocol during bolus administration of intravenous
contrast. Multiplanar CT image reconstructions and MIPs were
obtained to evaluate the vascular anatomy.
CONTRAST:  75mL OMNIPAQUE IOHEXOL 350 MG/ML SOLN

[Series 6: thins · axial · 0.74mm/px · z∈[+996,+1268]mm · 15 of 307 slices shown]
[im 17/307  lung]
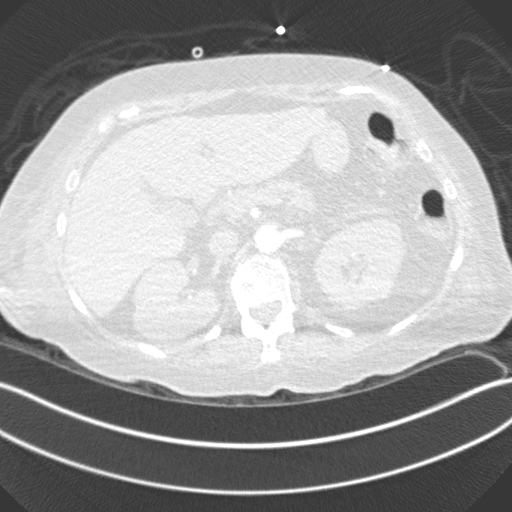
[im 33/307  soft-tissue]
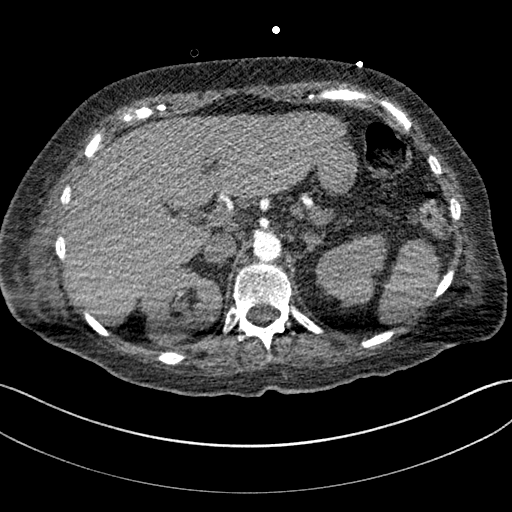
[im 65/307  lung]
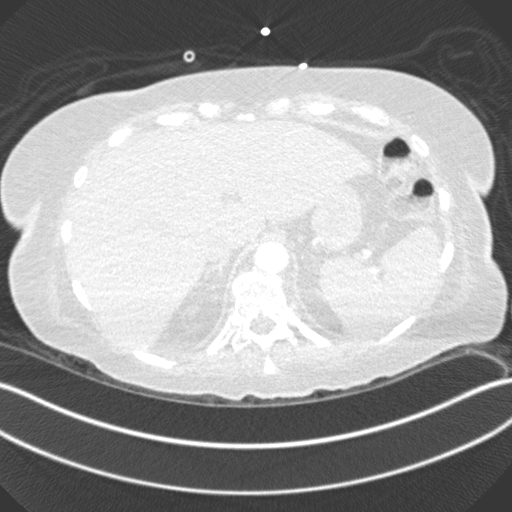
[im 81/307  soft-tissue]
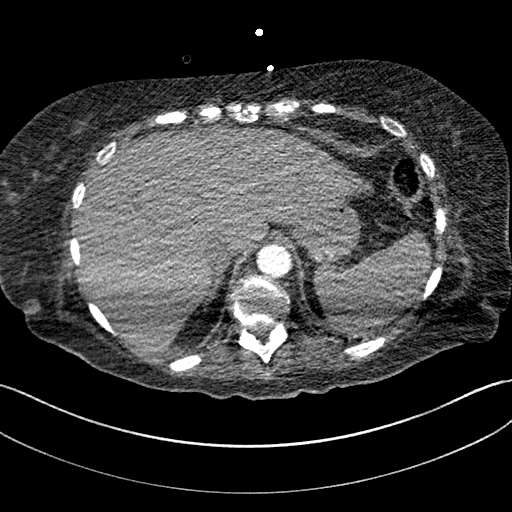
[im 97/307  lung]
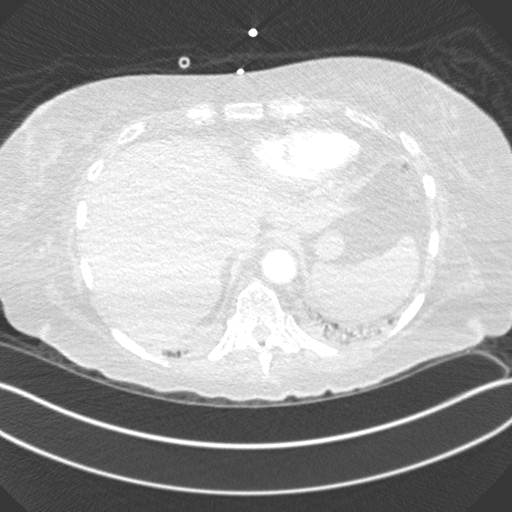
[im 113/307  soft-tissue]
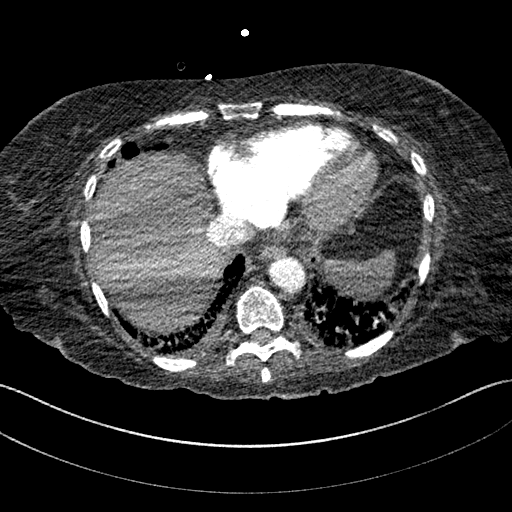
[im 129/307  lung]
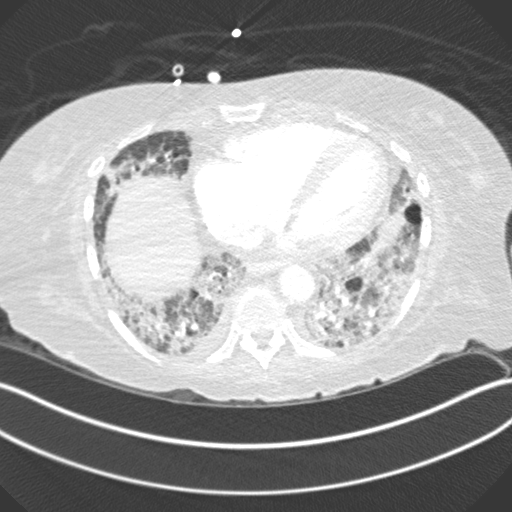
[im 162/307  soft-tissue]
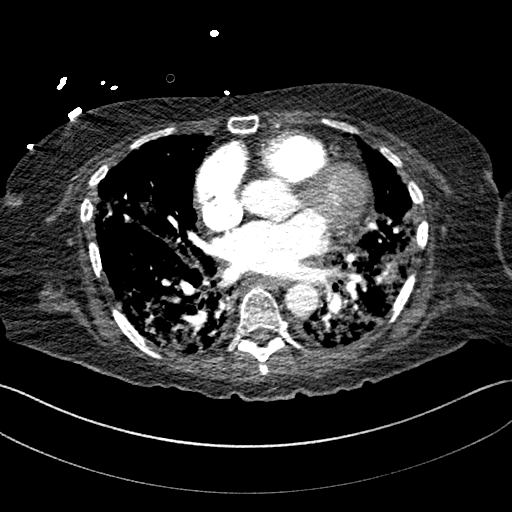
[im 178/307  lung]
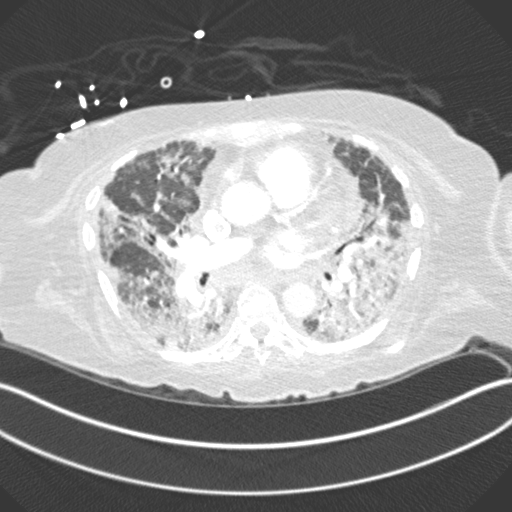
[im 194/307  soft-tissue]
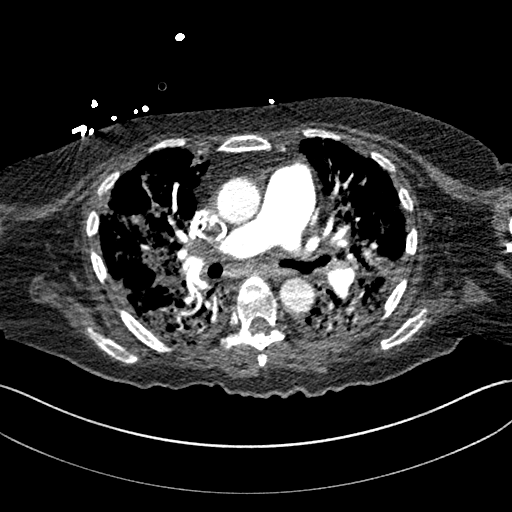
[im 210/307  lung]
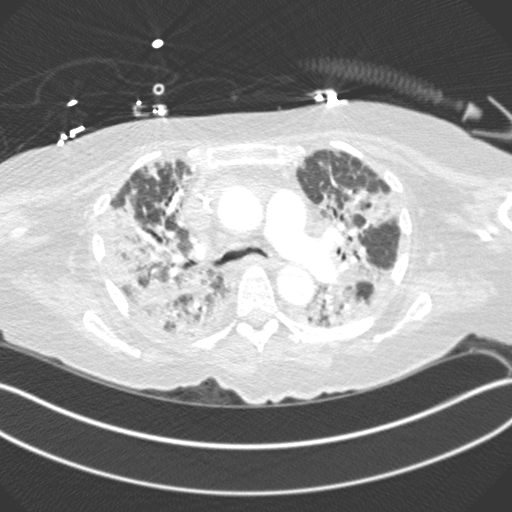
[im 226/307  soft-tissue]
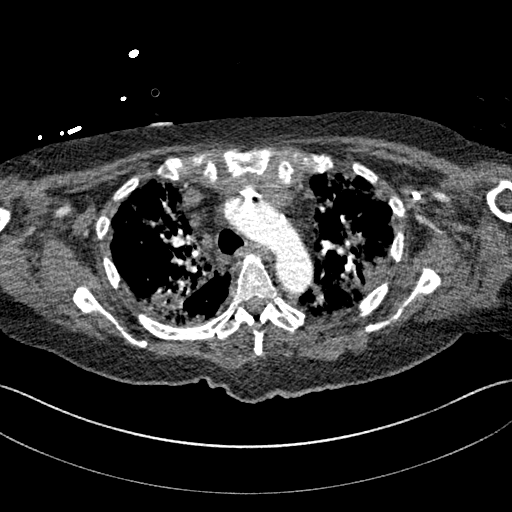
[im 258/307  lung]
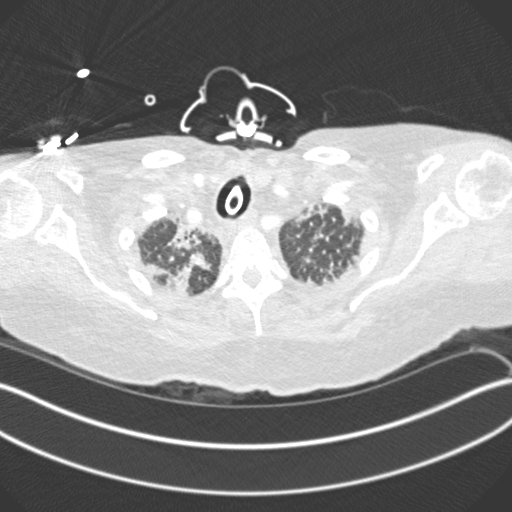
[im 274/307  soft-tissue]
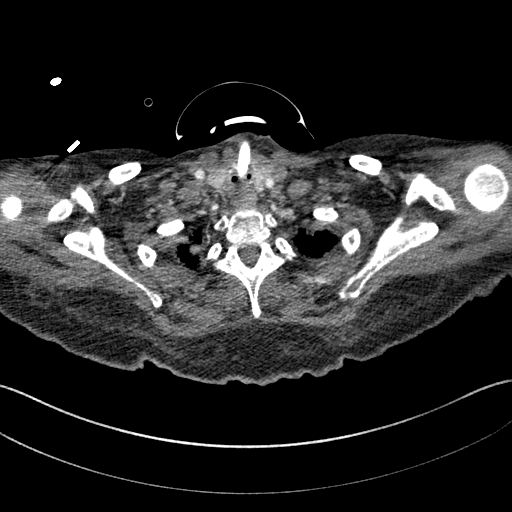
[im 290/307  lung]
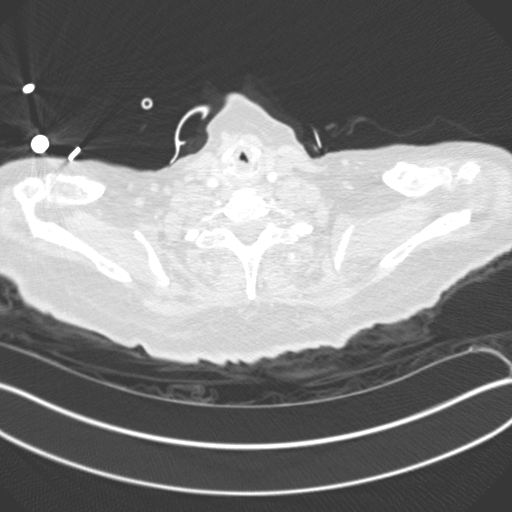

[Series 8: coronal mpr · coronal · 0.67mm/px · 3 of 151 slices shown]
[im 38/151  soft-tissue]
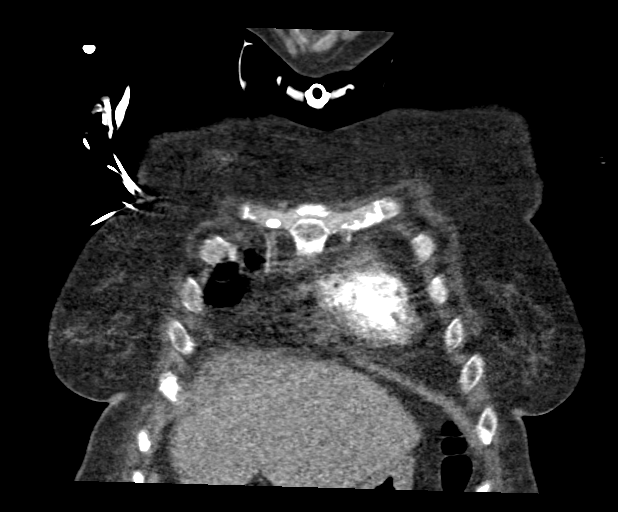
[im 76/151  soft-tissue]
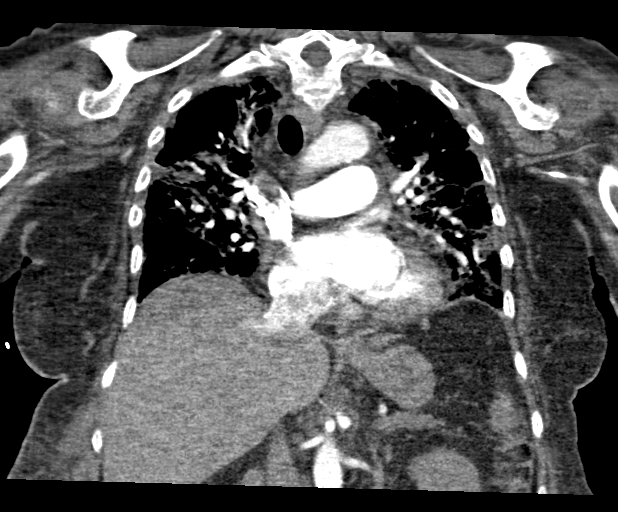
[im 113/151  soft-tissue]
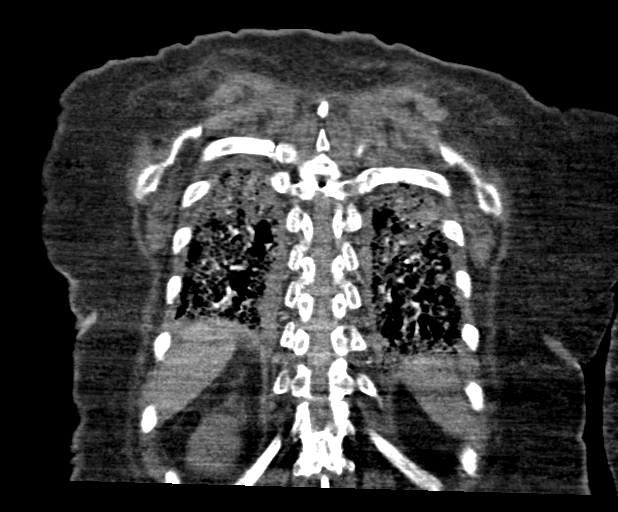

[18 of 46 positions shown; findings below may reference images not displayed]

FINDINGS: Cardiovascular: Thoracic aorta demonstrates atherosclerotic
calcifications without aneurysmal dilatation or dissection. Heart is
at the upper limits of normal in size. The pulmonary artery shows a
normal branching pattern without intraluminal filling defect to
suggest pulmonary embolism.

Mediastinum/Nodes: Thoracic inlet shows tracheostomy tube in
satisfactory position. Right-sided PICC line is noted extending into
the superior vena cava. Scattered small hilar and mediastinal lymph
nodes are noted which are likely reactive in nature. The esophagus
is within normal limits.

Lungs/Pleura: Lungs are well aerated bilaterally. Patchy airspace
opacities are identified in a somewhat confluent manner in the bases
bilaterally. Tiny right pleural effusion is noted. Given the
patient's clinical history these likely represent sequelae from
prior 9TQXM-8V infection. Superimposed acute bacterial pneumonia
could not be totally excluded. The overall appearance is similar to
that seen on prior chest x-ray from the previous day as well as
06/27/2019.

Upper Abdomen: Visualized upper abdomen shows no acute abnormality.

Musculoskeletal: No acute bony abnormality is noted.

Review of the MIP images confirms the above findings.
IMPRESSION: Patchy airspace opacities bilaterally similar to that seen on recent
chest x-ray. This likely represents sequelae from the prior 9TQXM-8V
infection. Superimposed acute pneumonia however could not be totally
excluded.

No evidence of pulmonary emboli.

Aortic Atherosclerosis ([id number]).
